# Patient Record
Sex: Male | Born: 1980 | Race: Black or African American | Hispanic: No | Marital: Single | Smoking: Current every day smoker
Health system: Southern US, Community
[De-identification: ages and names within clinical notes are randomized; demographics above are authoritative.]

## PROBLEM LIST (undated history)

## (undated) DIAGNOSIS — Z720 Tobacco use: Secondary | ICD-10-CM

## (undated) DIAGNOSIS — F1011 Alcohol abuse, in remission: Secondary | ICD-10-CM

---

## 2012-01-15 ENCOUNTER — Emergency Department: Payer: Self-pay | Admitting: Emergency Medicine

## 2012-01-18 ENCOUNTER — Emergency Department: Payer: Self-pay | Admitting: Emergency Medicine

## 2012-04-29 ENCOUNTER — Emergency Department: Payer: Self-pay | Admitting: Unknown Physician Specialty

## 2012-04-29 LAB — CBC
HCT: 42.6 % (ref 40.0–52.0)
HGB: 15 g/dL (ref 13.0–18.0)
MCHC: 35.1 g/dL (ref 32.0–36.0)
MCV: 91 fL (ref 80–100)
Platelet: 285 10*3/uL (ref 150–440)
RBC: 4.67 10*6/uL (ref 4.40–5.90)
WBC: 8 10*3/uL (ref 3.8–10.6)

## 2012-04-29 LAB — URINALYSIS, COMPLETE
Bacteria: NONE SEEN
Bilirubin,UR: NEGATIVE
Glucose,UR: NEGATIVE mg/dL (ref 0–75)
Leukocyte Esterase: NEGATIVE

## 2012-04-29 LAB — COMPREHENSIVE METABOLIC PANEL
Anion Gap: 8 (ref 7–16)
BUN: 11 mg/dL (ref 7–18)
Bilirubin,Total: 0.4 mg/dL (ref 0.2–1.0)
Chloride: 106 mmol/L (ref 98–107)
Co2: 23 mmol/L (ref 21–32)
Creatinine: 0.85 mg/dL (ref 0.60–1.30)
Glucose: 98 mg/dL (ref 65–99)
SGOT(AST): 26 U/L (ref 15–37)
Sodium: 137 mmol/L (ref 136–145)

## 2012-04-29 LAB — DRUG SCREEN, URINE
Amphetamines, Ur Screen: NEGATIVE (ref ?–1000)
Benzodiazepine, Ur Scrn: NEGATIVE (ref ?–200)
Cannabinoid 50 Ng, Ur ~~LOC~~: POSITIVE (ref ?–50)
MDMA (Ecstasy)Ur Screen: NEGATIVE (ref ?–500)
Opiate, Ur Screen: NEGATIVE (ref ?–300)

## 2012-04-29 LAB — LIPASE, BLOOD: Lipase: 75 U/L (ref 73–393)

## 2012-05-25 ENCOUNTER — Emergency Department: Payer: Self-pay | Admitting: Emergency Medicine

## 2012-05-25 LAB — COMPREHENSIVE METABOLIC PANEL
Albumin: 4.3 g/dL (ref 3.4–5.0)
Anion Gap: 7 (ref 7–16)
BUN: 12 mg/dL (ref 7–18)
Bilirubin,Total: 0.5 mg/dL (ref 0.2–1.0)
Calcium, Total: 9.1 mg/dL (ref 8.5–10.1)
Chloride: 104 mmol/L (ref 98–107)
Co2: 26 mmol/L (ref 21–32)
Creatinine: 0.86 mg/dL (ref 0.60–1.30)
SGOT(AST): 23 U/L (ref 15–37)
Sodium: 137 mmol/L (ref 136–145)
Total Protein: 8.6 g/dL — ABNORMAL HIGH (ref 6.4–8.2)

## 2012-05-25 LAB — URINALYSIS, COMPLETE
Bilirubin,UR: NEGATIVE
Protein: 100
Squamous Epithelial: NONE SEEN
WBC UR: <1 /HPF (ref 0–5)

## 2012-05-25 LAB — LIPASE, BLOOD: Lipase: 54 U/L — ABNORMAL LOW (ref 73–393)

## 2012-05-25 LAB — CBC
MCH: 31.6 pg (ref 26.0–34.0)
MCV: 93 fL (ref 80–100)
RDW: 13.8 % (ref 11.5–14.5)
WBC: 7.6 10*3/uL (ref 3.8–10.6)

## 2013-11-07 ENCOUNTER — Emergency Department: Payer: Self-pay | Admitting: Emergency Medicine

## 2013-11-07 LAB — CBC
HCT: 41.6 % (ref 40.0–52.0)
HGB: 14.5 g/dL (ref 13.0–18.0)
MCH: 32.9 pg (ref 26.0–34.0)
MCHC: 34.9 g/dL (ref 32.0–36.0)
MCV: 94 fL (ref 80–100)
Platelet: 292 10*3/uL (ref 150–440)
RBC: 4.41 10*6/uL (ref 4.40–5.90)
RDW: 13.2 % (ref 11.5–14.5)
WBC: 6.3 10*3/uL (ref 3.8–10.6)

## 2013-11-07 LAB — URINALYSIS, COMPLETE
Bacteria: NONE SEEN
Bilirubin,UR: NEGATIVE
GLUCOSE, UR: NEGATIVE mg/dL (ref 0–75)
KETONE: NEGATIVE
Leukocyte Esterase: NEGATIVE
NITRITE: NEGATIVE
Ph: 5 (ref 4.5–8.0)
Protein: 100
RBC,UR: <1 /HPF (ref 0–5)
SPECIFIC GRAVITY: 1.015 (ref 1.003–1.030)
WBC UR: 1 /HPF (ref 0–5)

## 2013-11-07 LAB — ETHANOL: Ethanol %: <0.003 % (ref 0.000–0.080)

## 2013-11-07 LAB — COMPREHENSIVE METABOLIC PANEL
ANION GAP: 3 — AB (ref 7–16)
Albumin: 3.7 g/dL (ref 3.4–5.0)
Alkaline Phosphatase: 70 U/L
BUN: 19 mg/dL — ABNORMAL HIGH (ref 7–18)
Bilirubin,Total: 0.2 mg/dL (ref 0.2–1.0)
CHLORIDE: 104 mmol/L (ref 98–107)
Calcium, Total: 9 mg/dL (ref 8.5–10.1)
Co2: 29 mmol/L (ref 21–32)
Creatinine: 1.15 mg/dL (ref 0.60–1.30)
EGFR (African American): >60
GLUCOSE: 154 mg/dL — AB (ref 65–99)
Osmolality: 277 (ref 275–301)
Potassium: 3.8 mmol/L (ref 3.5–5.1)
SGOT(AST): 25 U/L (ref 15–37)
SGPT (ALT): 28 U/L (ref 12–78)
Sodium: 136 mmol/L (ref 136–145)
Total Protein: 7.9 g/dL (ref 6.4–8.2)

## 2014-03-17 ENCOUNTER — Ambulatory Visit: Payer: Self-pay | Admitting: Internal Medicine

## 2014-10-19 ENCOUNTER — Encounter: Payer: Self-pay | Admitting: *Deleted

## 2014-10-19 ENCOUNTER — Emergency Department
Admission: EM | Admit: 2014-10-19 | Discharge: 2014-10-19 | Discharge status: Against Medical Advice | Payer: Self-pay | Attending: Student | Admitting: Student

## 2014-10-19 DIAGNOSIS — R1030 Lower abdominal pain, unspecified: Secondary | ICD-10-CM | POA: Insufficient documentation

## 2014-10-19 DIAGNOSIS — Z720 Tobacco use: Secondary | ICD-10-CM | POA: Insufficient documentation

## 2014-10-19 LAB — CBC WITH DIFFERENTIAL/PLATELET
BASOS ABS: 0 10*3/uL (ref 0–0.1)
BASOS PCT: 0 %
Eosinophils Absolute: 0 10*3/uL (ref 0–0.7)
Eosinophils Relative: 0 %
HCT: 44.1 % (ref 40.0–52.0)
Hemoglobin: 15.2 g/dL (ref 13.0–18.0)
Lymphocytes Relative: 5 %
Lymphs Abs: 0.7 10*3/uL — ABNORMAL LOW (ref 1.0–3.6)
MCH: 31.6 pg (ref 26.0–34.0)
MCHC: 34.5 g/dL (ref 32.0–36.0)
MCV: 91.7 fL (ref 80.0–100.0)
MONO ABS: 0.5 10*3/uL (ref 0.2–1.0)
MONOS PCT: 4 %
NEUTROS ABS: 11.7 10*3/uL — AB (ref 1.4–6.5)
NEUTROS PCT: 91 %
Platelets: 278 10*3/uL (ref 150–440)
RBC: 4.81 MIL/uL (ref 4.40–5.90)
RDW: 13.8 % (ref 11.5–14.5)
WBC: 13 10*3/uL — ABNORMAL HIGH (ref 3.8–10.6)

## 2014-10-19 LAB — COMPREHENSIVE METABOLIC PANEL
ALT: 42 U/L (ref 17–63)
ANION GAP: 11 (ref 5–15)
AST: 36 U/L (ref 15–41)
Albumin: 4.7 g/dL (ref 3.5–5.0)
Alkaline Phosphatase: 81 U/L (ref 38–126)
BUN: 20 mg/dL (ref 6–20)
CALCIUM: 10 mg/dL (ref 8.9–10.3)
CO2: 23 mmol/L (ref 22–32)
Chloride: 100 mmol/L — ABNORMAL LOW (ref 101–111)
Creatinine, Ser: 1.01 mg/dL (ref 0.61–1.24)
GFR calc non Af Amer: >60 mL/min (ref >60)
GLUCOSE: 144 mg/dL — AB (ref 65–99)
Potassium: 3.7 mmol/L (ref 3.5–5.1)
Sodium: 134 mmol/L — ABNORMAL LOW (ref 135–145)
Total Bilirubin: 0.7 mg/dL (ref 0.3–1.2)
Total Protein: 8.5 g/dL — ABNORMAL HIGH (ref 6.5–8.1)

## 2014-10-19 LAB — URINALYSIS COMPLETE WITH MICROSCOPIC (ARMC ONLY)
Bilirubin Urine: NEGATIVE
Glucose, UA: NEGATIVE mg/dL
Leukocytes, UA: NEGATIVE
Nitrite: NEGATIVE
Protein, ur: 100 mg/dL — AB
SPECIFIC GRAVITY, URINE: 1.017 (ref 1.005–1.030)
SQUAMOUS EPITHELIAL / LPF: NONE SEEN
pH: 5 (ref 5.0–8.0)

## 2014-10-19 LAB — AMYLASE: Amylase: 31 U/L (ref 28–100)

## 2014-10-19 LAB — LIPASE, BLOOD: Lipase: 27 U/L (ref 22–51)

## 2014-10-19 NOTE — ED Notes (Signed)
Pt is here with lower abdominal pain.  Earlier pt had nausea but no vomiting or diarrhea, no fever or chills.

## 2014-10-19 NOTE — ED Notes (Signed)
Pt reports that the pain began when he was drinking today.  Pt reports that he drinks daily (2 40's) and that he gets the shakes when he doesn't drink.

## 2014-10-19 NOTE — ED Provider Notes (Signed)
-----------------------------------------   11:45 PM on 10/19/2014 -----------------------------------------  I did not see, evaluate, or participate in the care of this patient.  Gayla Doss, MD 10/19/14 [phone]

## 2015-06-24 ENCOUNTER — Encounter: Payer: Self-pay | Admitting: Emergency Medicine

## 2015-06-24 ENCOUNTER — Emergency Department
Admission: EM | Admit: 2015-06-24 | Discharge: 2015-06-24 | Disposition: A | Discharge status: Home / Self Care | Payer: Self-pay | Attending: Emergency Medicine | Admitting: Emergency Medicine

## 2015-06-24 DIAGNOSIS — Z0000 Encounter for general adult medical examination without abnormal findings: Secondary | ICD-10-CM | POA: Insufficient documentation

## 2015-06-24 DIAGNOSIS — Z139 Encounter for screening, unspecified: Secondary | ICD-10-CM

## 2015-06-24 DIAGNOSIS — F17210 Nicotine dependence, cigarettes, uncomplicated: Secondary | ICD-10-CM | POA: Insufficient documentation

## 2015-06-24 HISTORY — DX: Alcohol abuse, in remission: F10.11

## 2015-06-24 NOTE — ED Provider Notes (Signed)
[facility] Emergency Department Provider Note  ____________________________________________  Time seen: On arrival  I have reviewed the triage vital signs and the nursing notes.   HISTORY  Chief Complaint Medical Clearance    HPI Francisco Barrett is a 35 y.o. male who presents for medical clearance for detox. Patient reports he feels well he has no complaints. He went to detox program and they sent him to the emergency department for medical clearance. He reports he drinks alcohol and smokes marijuana daily but has not had anything since yesterday. He does not feel like he is withdrawing.    Past Medical History  Diagnosis Date  . H/O ETOH abuse     There are no active problems to display for this patient.   History reviewed. No pertinent past surgical history.  No current outpatient prescriptions on file.  Allergies Review of patient's allergies indicates no known allergies.  No family history on file.  Social History Social History  Substance Use Topics  . Smoking status: Current Every Day Smoker -- 0.50 packs/day    Types: Cigarettes  . Smokeless tobacco: None  . Alcohol Use: Yes     Comment: 2 40's    Review of Systems  Constitutional: Negative for fever. Eyes: Negative for visual changes.    Musculoskeletal: Negative for back pain. Skin: Negative for rash. Neurological: Negative for headaches    ____________________________________________   PHYSICAL EXAM:  VITAL SIGNS: ED Triage Vitals  Enc Vitals Group     BP 06/24/15 1301 147/86 mmHg     Pulse Rate 06/24/15 1301 60     Resp 06/24/15 1301 20     Temp 06/24/15 1301 98.2 F (36.8 C)     Temp Source 06/24/15 1301 Oral     SpO2 06/24/15 1301 99 %     Weight 06/24/15 1301 120 lb (54.432 kg)     Height 06/24/15 1301  (1.6 m)     Head Cir --      Peak Flow --      Pain Score --      Pain Loc --      Pain Edu? --      Excl. in GC? --       Constitutional: Alert and oriented. Well appearing and in no distress. Eyes: Conjunctivae are normal.  ENT   Head: Normocephalic and atraumatic.   Mouth/Throat: Mucous membranes are moist. Cardiovascular: Normal rate, regular rhythm.  Respiratory: Normal respiratory effort without tachypnea nor retractions.  Gastrointestinal: Soft and non-tender in all quadrants. No distention. There is no CVA tenderness. Musculoskeletal: Nontender with normal range of motion in all extremities. Neurologic:  Normal speech and language. No gross focal neurologic deficits are appreciated. Skin:  Skin is warm, dry and intact. No rash noted. Psychiatric: Mood and affect are normal. Patient exhibits appropriate insight and judgment.  ____________________________________________    LABS (pertinent positives/negatives)  Labs Reviewed - No data to display  ____________________________________________     ____________________________________________    RADIOLOGY I have personally reviewed any xrays that were ordered on this patient: None  ____________________________________________   PROCEDURES  Procedure(s) performed: none   ____________________________________________   INITIAL IMPRESSION / ASSESSMENT AND PLAN / ED COURSE  Pertinent labs & imaging results that were available during my care of the patient were reviewed by me and considered in my medical decision making (see chart for details).  Patient well-appearing and in no distress. Did not feel labs are indicated as the patient is  young and healthy and has no medical history besides his alcohol use.  ____________________________________________   FINAL CLINICAL IMPRESSION(S) / ED DIAGNOSES  Final diagnoses:  Encounter for medical screening examination     Jene Every, MD 06/24/15 [phone]

## 2015-06-24 NOTE — Discharge Instructions (Signed)
Francisco Barrett is medically cleared for detox

## 2015-06-24 NOTE — ED Notes (Signed)
States went to [facility] for detox from ETOH. States they had him come here for clearance. Denies significant withdrawal or seizure with disuse in past. States last drink was yesterday.

## 2015-06-24 NOTE — Progress Notes (Signed)
Per the EDP requests' the pts vitals and H&P will be faxed to [facility].    06/24/2015 Cheryl Flash, MS, NCC, LPCA Therapeutic Triage Specialist

## 2017-05-05 ENCOUNTER — Emergency Department
Admission: EM | Admit: 2017-05-05 | Discharge: 2017-05-05 | Disposition: A | Discharge status: Home / Self Care | Payer: No Typology Code available for payment source | Attending: Emergency Medicine | Admitting: Emergency Medicine

## 2017-05-05 ENCOUNTER — Other Ambulatory Visit: Payer: Self-pay

## 2017-05-05 ENCOUNTER — Emergency Department: Payer: No Typology Code available for payment source

## 2017-05-05 ENCOUNTER — Encounter: Payer: Self-pay | Admitting: Emergency Medicine

## 2017-05-05 DIAGNOSIS — Y999 Unspecified external cause status: Secondary | ICD-10-CM | POA: Insufficient documentation

## 2017-05-05 DIAGNOSIS — Y92410 Unspecified street and highway as the place of occurrence of the external cause: Secondary | ICD-10-CM | POA: Insufficient documentation

## 2017-05-05 DIAGNOSIS — S3992XA Unspecified injury of lower back, initial encounter: Secondary | ICD-10-CM | POA: Diagnosis present

## 2017-05-05 DIAGNOSIS — S39012A Strain of muscle, fascia and tendon of lower back, initial encounter: Secondary | ICD-10-CM | POA: Diagnosis not present

## 2017-05-05 DIAGNOSIS — F17210 Nicotine dependence, cigarettes, uncomplicated: Secondary | ICD-10-CM | POA: Insufficient documentation

## 2017-05-05 DIAGNOSIS — Y939 Activity, unspecified: Secondary | ICD-10-CM | POA: Diagnosis not present

## 2017-05-05 MED ORDER — TRAMADOL HCL 50 MG PO TABS: 50.0000 mg | ORAL_TABLET | Freq: Four times a day (QID) | ORAL | 0 refills | Status: AC | PRN

## 2017-05-05 MED ORDER — CYCLOBENZAPRINE HCL 10 MG PO TABS: 10.0000 mg | ORAL_TABLET | Freq: Three times a day (TID) | ORAL | 0 refills | Status: DC | PRN
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
## 2019-11-24 ENCOUNTER — Emergency Department: Payer: Self-pay

## 2019-11-24 ENCOUNTER — Other Ambulatory Visit: Payer: Self-pay

## 2019-11-24 ENCOUNTER — Encounter: Payer: Self-pay | Admitting: Emergency Medicine

## 2019-11-24 ENCOUNTER — Emergency Department
Admission: EM | Admit: 2019-11-24 | Discharge: 2019-11-24 | Disposition: A | Discharge status: Home / Self Care | Payer: Self-pay | Attending: Emergency Medicine | Admitting: Emergency Medicine

## 2019-11-24 DIAGNOSIS — F17210 Nicotine dependence, cigarettes, uncomplicated: Secondary | ICD-10-CM | POA: Insufficient documentation

## 2019-11-24 DIAGNOSIS — G8929 Other chronic pain: Secondary | ICD-10-CM | POA: Insufficient documentation

## 2019-11-24 DIAGNOSIS — M25572 Pain in left ankle and joints of left foot: Secondary | ICD-10-CM | POA: Insufficient documentation

## 2019-11-24 MED ORDER — MELOXICAM 7.5 MG PO TABS: 7.5000 mg | ORAL_TABLET | Freq: Every day | ORAL | 0 refills | Status: AC

## 2019-11-24 MED ORDER — ACETAMINOPHEN 325 MG PO TABS
650.0000 mg | ORAL_TABLET | Freq: Once | ORAL | Status: AC
  Administered 2019-11-24: 650 mg via ORAL
  Filled 2019-11-24: qty 2
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
## 2019-11-24 NOTE — Discharge Instructions (Addendum)
Follow-up with Dr. Signa Kell who is the orthopedist on-call for any continued problems with your ankle.  You may also follow-up with your doctor at [facility].  A prescription for meloxicam was sent to your pharmacy to take once a day to reduce inflammation which will help with pain.  Elevation when possible.
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
## 2020-10-06 ENCOUNTER — Emergency Department (HOSPITAL_COMMUNITY): Payer: Self-pay

## 2020-10-06 ENCOUNTER — Observation Stay (HOSPITAL_COMMUNITY)
Admission: EM | Admit: 2020-10-06 | Discharge: 2020-10-07 | Disposition: A | Discharge status: Home / Self Care | Payer: Self-pay | Attending: Internal Medicine | Admitting: Internal Medicine

## 2020-10-06 ENCOUNTER — Other Ambulatory Visit: Payer: Self-pay

## 2020-10-06 ENCOUNTER — Encounter (HOSPITAL_COMMUNITY): Payer: Self-pay | Admitting: Emergency Medicine

## 2020-10-06 DIAGNOSIS — F17210 Nicotine dependence, cigarettes, uncomplicated: Secondary | ICD-10-CM | POA: Insufficient documentation

## 2020-10-06 DIAGNOSIS — R0602 Shortness of breath: Secondary | ICD-10-CM

## 2020-10-06 DIAGNOSIS — D72829 Elevated white blood cell count, unspecified: Secondary | ICD-10-CM | POA: Insufficient documentation

## 2020-10-06 DIAGNOSIS — Y900 Blood alcohol level of less than 20 mg/100 ml: Secondary | ICD-10-CM | POA: Insufficient documentation

## 2020-10-06 DIAGNOSIS — Z20822 Contact with and (suspected) exposure to covid-19: Secondary | ICD-10-CM | POA: Insufficient documentation

## 2020-10-06 DIAGNOSIS — Z720 Tobacco use: Secondary | ICD-10-CM

## 2020-10-06 DIAGNOSIS — T401X1A Poisoning by heroin, accidental (unintentional), initial encounter: Secondary | ICD-10-CM | POA: Insufficient documentation

## 2020-10-06 DIAGNOSIS — J9601 Acute respiratory failure with hypoxia: Principal | ICD-10-CM | POA: Diagnosis present

## 2020-10-06 DIAGNOSIS — F1910 Other psychoactive substance abuse, uncomplicated: Secondary | ICD-10-CM | POA: Insufficient documentation

## 2020-10-06 HISTORY — DX: Tobacco use: Z72.0

## 2020-10-06 LAB — URINALYSIS, ROUTINE W REFLEX MICROSCOPIC
Bacteria, UA: NONE SEEN
Bilirubin Urine: NEGATIVE
Glucose, UA: 50 mg/dL — AB
Ketones, ur: 5 mg/dL — AB
Leukocytes,Ua: NEGATIVE
Nitrite: NEGATIVE
Protein, ur: 100 mg/dL — AB
Specific Gravity, Urine: 1.014 (ref 1.005–1.030)
pH: 5 (ref 5.0–8.0)

## 2020-10-06 LAB — CBC
HCT: 42.1 % (ref 39.0–52.0)
Hemoglobin: 14.5 g/dL (ref 13.0–17.0)
MCH: 32.3 pg (ref 26.0–34.0)
MCHC: 34.4 g/dL (ref 30.0–36.0)
MCV: 93.8 fL (ref 80.0–100.0)
Platelets: 301 10*3/uL (ref 150–400)
RBC: 4.49 MIL/uL (ref 4.22–5.81)
RDW: 13.2 % (ref 11.5–15.5)
WBC: 16 10*3/uL — ABNORMAL HIGH (ref 4.0–10.5)
nRBC: 0 % (ref 0.0–0.2)

## 2020-10-06 LAB — RAPID URINE DRUG SCREEN, HOSP PERFORMED
Amphetamines: NOT DETECTED
Barbiturates: NOT DETECTED
Benzodiazepines: POSITIVE — AB
Cocaine: NOT DETECTED
Opiates: NOT DETECTED
Tetrahydrocannabinol: POSITIVE — AB

## 2020-10-06 LAB — BASIC METABOLIC PANEL
Anion gap: 9 (ref 5–15)
BUN: 8 mg/dL (ref 6–20)
CO2: 28 mmol/L (ref 22–32)
Calcium: 9.2 mg/dL (ref 8.9–10.3)
Chloride: 102 mmol/L (ref 98–111)
Creatinine, Ser: 0.79 mg/dL (ref 0.61–1.24)
GFR, Estimated: >60 mL/min (ref >60)
Glucose, Bld: 126 mg/dL — ABNORMAL HIGH (ref 70–99)
Potassium: 3.5 mmol/L (ref 3.5–5.1)
Sodium: 139 mmol/L (ref 135–145)

## 2020-10-06 LAB — RESP PANEL BY RT-PCR (FLU A&B, COVID) ARPGX2
Influenza A by PCR: NEGATIVE
Influenza B by PCR: NEGATIVE
SARS Coronavirus 2 by RT PCR: NEGATIVE

## 2020-10-06 LAB — ETHANOL: Alcohol, Ethyl (B): <10 mg/dL (ref <10)

## 2020-10-06 LAB — MAGNESIUM: Magnesium: 2.2 mg/dL (ref 1.7–2.4)

## 2020-10-06 MED ORDER — ACETAMINOPHEN 325 MG PO TABS: 650.0000 mg | ORAL_TABLET | Freq: Four times a day (QID) | ORAL | Status: DC | PRN

## 2020-10-06 MED ORDER — ACETAMINOPHEN 650 MG RE SUPP: 650.0000 mg | Freq: Four times a day (QID) | RECTAL | Status: DC | PRN

## 2020-10-06 MED ORDER — LACTATED RINGERS IV BOLUS
1000.0000 mL | Freq: Once | INTRAVENOUS | Status: AC
  Administered 2020-10-06: 1000 mL via INTRAVENOUS

## 2020-10-06 MED ORDER — HYDRALAZINE HCL 20 MG/ML IJ SOLN: 10.0000 mg | INTRAMUSCULAR | Status: DC | PRN

## 2020-10-06 MED ORDER — LACTATED RINGERS IV SOLN: INTRAVENOUS | Status: DC

## 2020-10-06 MED ORDER — ALBUTEROL SULFATE HFA 108 (90 BASE) MCG/ACT IN AERS
2.0000 | INHALATION_SPRAY | Freq: Once | RESPIRATORY_TRACT | Status: AC
  Administered 2020-10-06: 2 via RESPIRATORY_TRACT
  Filled 2020-10-06: qty 6.7

## 2020-10-06 MED ORDER — NALOXONE HCL 0.4 MG/ML IJ SOLN: 0.4000 mg | INTRAMUSCULAR | Status: DC | PRN

## 2020-10-06 MED ORDER — METHYLPREDNISOLONE SODIUM SUCC 125 MG IJ SOLR
125.0000 mg | Freq: Once | INTRAMUSCULAR | Status: AC
  Administered 2020-10-06: 125 mg via INTRAVENOUS
  Filled 2020-10-06: qty 2

## 2020-10-06 MED ORDER — ALBUTEROL SULFATE (2.5 MG/3ML) 0.083% IN NEBU: 2.5000 mg | INHALATION_SOLUTION | RESPIRATORY_TRACT | Status: DC | PRN

## 2020-10-06 NOTE — ED Provider Notes (Signed)
[facility]-EMERGENCY DEPT Provider Note   CSN: [id number] Arrival date & time: 10/06/20  1157     History Chief Complaint  Patient presents with   Drug Overdose    Laird Ronel Rodeheaver is a 40 y.o. male.  HPI Level 5 caveat secondary to intoxication 40 year old male presents to the ER after loss of consciousness, reportedly use intranasal heroin while at work today.  Unclear if he hit his head or not.  He was found to have agonal respirations by EMS, approximately 6/min.  EMS administered BVM, administered 0.5 mg of Narcan with ROSC.  Patient became alert and oriented x4.  On my exam, patient is arousable, able to answer some questions but is still very much and intoxicated.    Past Medical History:  Diagnosis Date   H/O ETOH abuse     There are no problems to display for this patient.   History reviewed. No pertinent surgical history.     No family history on file.  Social History   Tobacco Use   Smoking status: Every Day    Packs/day: 0.50    Pack years: 0.00    Types: Cigarettes   Smokeless tobacco: Never  Vaping Use   Vaping Use: Never used  Substance Use Topics   Alcohol use: Yes    Comment: 2 40's   Drug use: Yes    Types: Marijuana, Cocaine    Home Medications Prior to Admission medications   Medication Sig Start Date End Date Taking? Authorizing Provider  meloxicam (MOBIC) 7.5 MG tablet Take 1 tablet (7.5 mg total) by mouth daily. 11/24/19 11/23/20  Tommi Rumps, PA-C    Allergies    Patient has no known allergies.  Review of Systems   Review of Systems  Unable to perform ROS: Mental status change   Physical Exam Updated Vital Signs BP 128/85   Pulse (!) 56   Temp (!) 97.4 F (36.3 C) (Oral)   Resp 12   SpO2 93%   Physical Exam Vitals and nursing note reviewed.  Constitutional:      Appearance: Normal appearance. He is well-developed. He is not ill-appearing.  HENT:     Head: Normocephalic and  atraumatic.  Eyes:     Conjunctiva/sclera: Conjunctivae normal.  Cardiovascular:     Rate and Rhythm: Normal rate and regular rhythm.     Heart sounds: No murmur heard. Pulmonary:     Effort: Pulmonary effort is normal. No respiratory distress.     Breath sounds: Normal breath sounds.  Abdominal:     Palpations: Abdomen is soft.     Tenderness: There is no abdominal tenderness.  Musculoskeletal:     Cervical back: Neck supple.  Skin:    General: Skin is warm and dry.  Neurological:     Comments: Patient is sleepy, but easily arousable.  Answering some questions appropriately but does appear to be visibly intoxicated still.  Cranial nerves II through XII intact    ED Results / Procedures / Treatments   Labs (all labs ordered are listed, but only abnormal results are displayed) Labs Reviewed  CBC - Abnormal; Notable for the following components:      Result Value   WBC 16.0 (*)    All other components within normal limits  BASIC METABOLIC PANEL - Abnormal; Notable for the following components:   Glucose, Bld 126 (*)    All other components within normal limits  URINALYSIS, ROUTINE W REFLEX MICROSCOPIC - Abnormal; Notable for the  following components:   Glucose, UA 50 (*)    Hgb urine dipstick MODERATE (*)    Ketones, ur 5 (*)    Protein, ur 100 (*)    All other components within normal limits  ETHANOL  RAPID URINE DRUG SCREEN, HOSP PERFORMED    EKG None  Radiology No results found.  Procedures Procedures   Medications Ordered in ED Medications - No data to display  ED Course  I have reviewed the triage vital signs and the nursing notes.  Pertinent labs & imaging results that were available during my care of the patient were reviewed by me and considered in my medical decision making (see chart for details).    MDM Rules/Calculators/A&P                          40 year old male who presents with heroin overdose.  He arrives still fairly sleepy, easily  arousable but can only answer some questions appropriately.  Plan for basic labs and CT of the head given unclear if he hit his head or not.  He will need to be observed until he becomes more clinically sober.  Labs with no significant anemia, CBC with a leukocytosis of 16 likely reactive to drug use.  Ethanol negative.  UA  with moderate materia, proteinuria, ketones, likely consistent with dehydration.  On reevaluation, patient is still clinically intoxicated.  Will need further observation.  Signed out to [employer].  If clinically sober, suspect the patient be stable for discharge.  Final Clinical Impression(s) / ED Diagnoses Final diagnoses:  Accidental overdose of heroin, initial encounter [facility])    Rx / DC Orders ED Discharge Orders     None        Leone Brand 10/06/20 1502    Vanetta Mulders, MD 10/09/20 1630

## 2020-10-06 NOTE — ED Notes (Signed)
Patients oxygen saturation dropped to 82% on room air. RN placed patient back on 4L [place] that improved his saturations to 99%.

## 2020-10-06 NOTE — ED Notes (Signed)
Patient ambulated in room. Patient said he felt "a little dizzy" and about lost his balance one time.

## 2020-10-06 NOTE — ED Provider Notes (Signed)
Francisco Barrett is a 40 y.o. male, presenting to the ED with concern for possible opiate overdose. To hear the patient tell his story, he states he did snort what he thought was heroin, but he now suspects may have been fentanyl, however, his loss of consciousness was delayed.  He is concerned he had exposure to a chemical in his lungs as he is employed by a [employer] and is around several chemicals that can be airborne. RN also tells me EMS reported they initially thought it was a chemical exposure and they followed hazmat protocols, dousing the patient copiously with water. He does admit to using heroin, crack cocaine, and frequent alcohol.  He will not quantify any of these amounts of use.  HPI from Trudee Grip, PA-C: "Level 5 caveat secondary to intoxication 40 year old male presents to the ER after loss of consciousness, reportedly use intranasal heroin while at work today.  Unclear if he hit his head or not.  He was found to have agonal respirations by EMS, approximately 6/min.  EMS administered BVM, administered 0.5 mg of Narcan with ROSC.  Patient became alert and oriented x4.  On my exam, patient is arousable, able to answer some questions but is still very much and intoxicated."   Past Medical History:  Diagnosis Date  . H/O ETOH abuse     Physical Exam  BP (!) 130/91   Pulse (!) 58   Temp (!) 97.4 F (36.3 C) (Oral)   Resp 12   SpO2 93%   Physical Exam Vitals and nursing note reviewed.  Constitutional:      General: He is not in acute distress.    Appearance: He is well-developed. He is not diaphoretic.  HENT:     Head: Normocephalic and atraumatic.     Mouth/Throat:     Mouth: Mucous membranes are moist.     Pharynx: Oropharynx is clear.  Eyes:     Conjunctiva/sclera: Conjunctivae normal.  Cardiovascular:     Rate and Rhythm: Normal rate and regular rhythm.     Pulses: Normal pulses.          Radial pulses are 2+ on the right side and 2+ on the left  side.       Posterior tibial pulses are 2+ on the right side and 2+ on the left side.     Heart sounds: Normal heart sounds.     Comments: Tactile temperature in the extremities appropriate and equal bilaterally. Pulmonary:     Breath sounds: Decreased breath sounds and wheezing present.  Abdominal:     Palpations: Abdomen is soft.     Tenderness: There is no abdominal tenderness. There is no guarding.  Musculoskeletal:     Cervical back: Neck supple.     Right lower leg: No edema.     Left lower leg: No edema.  Lymphadenopathy:     Cervical: No cervical adenopathy.  Skin:    General: Skin is warm and dry.  Neurological:     Mental Status: He is alert and oriented to person, place, and time.     Comments: No noted acute cognitive deficit. Sensation grossly intact to light touch in the extremities.   Grip strengths equal bilaterally.   Strength 5/5 in all extremities.  Coordination intact.  Cranial nerves III-XII grossly intact.  Handles oral secretions without noted difficulty.  No noted phonation or speech deficit. No facial droop.   Psychiatric:        Mood and Affect: Mood  and affect normal.        Speech: Speech normal.        Behavior: Behavior normal.    ED Course/Procedures     Procedures  Abnormal Labs Reviewed  CBC - Abnormal; Notable for the following components:      Result Value   WBC 16.0 (*)    All other components within normal limits  BASIC METABOLIC PANEL - Abnormal; Notable for the following components:   Glucose, Bld 126 (*)    All other components within normal limits  URINALYSIS, ROUTINE W REFLEX MICROSCOPIC - Abnormal; Notable for the following components:   Glucose, UA 50 (*)    Hgb urine dipstick MODERATE (*)    Ketones, ur 5 (*)    Protein, ur 100 (*)    All other components within normal limits  RAPID URINE DRUG SCREEN, HOSP PERFORMED - Abnormal; Notable for the following components:   Benzodiazepines POSITIVE (*)     Tetrahydrocannabinol POSITIVE (*)    All other components within normal limits    CT Head Wo Contrast  Result Date: 10/06/2020 CLINICAL DATA:  Recent heroin use with loss of consciousness. EXAM: CT HEAD WITHOUT CONTRAST TECHNIQUE: Contiguous axial images were obtained from the base of the skull through the vertex without intravenous contrast. COMPARISON:  None. FINDINGS: Brain: No evidence of acute infarction, hemorrhage, hydrocephalus, extra-axial collection or mass lesion/mass effect. Vascular: No hyperdense vessel or unexpected calcification. Skull: Normal. Negative for fracture or focal lesion. Sinuses/Orbits: Orbits are normal. Moderate opacification over the lower half of the right maxillary sinus. Other: None. IMPRESSION: 1. No acute findings. 2. Chronic inflammatory change of the right maxillary sinus. Electronically Signed   By: Elberta Fortis M.D.   On: 10/06/2020 15:43   DG CHEST PORT 1 VIEW  Result Date: 10/06/2020 CLINICAL DATA:  Shortness of breath. EXAM: PORTABLE CHEST 1 VIEW COMPARISON:  None. FINDINGS: The heart size and mediastinal contours are within normal limits. Both lungs are clear. The visualized skeletal structures are unremarkable. IMPRESSION: No active disease. Electronically Signed   By: Gerome Sam III M.D   On: 10/06/2020 17:51       MDM   Clinical Course as of 10/06/20 2157  Wynelle Link Oct 06, 2020  1722 Patient SPO2 on room air noted to be around 90%.  Lungs are diminished with possible wheezes.  Denies chest pain, shortness of breath. [SJ]  1920 Patient reevaluated.  He is sleeping, but easily arousable.  Continues to be completely oriented. Room air SPO2 up to 95% following albuterol inhaler treatments. [SJ]  2144 Spoke with Dr. Arlean Hopping, Hospitalist.  Agrees to admit the patient. [SJ]    Clinical Course User Index [SJ] Anselm Pancoast, PA-C   Patient care handoff report received from Trudee Grip, PA-C. Plan: Reassess until clinically sober.  Patient presents  following what was suspected to be opiate overdose.  Initially with respiratory rate of 6 breaths/min on scene with EMS.  Improved with Narcan. Upon my assessment, he is somnolent, but easily arousable and once awake he is oriented x4.  I did note some diminished lung sounds and possible wheezing.  SPO2 on room air at that time was around 90%.  Upon repeat assessment, this seemed to improve to around 95% on room air, however, there were a couple subsequent episodes of SPO2 readings into the 80s, requiring the patient to be placed back on supplemental O2. It is possible patient aspirated during his ordeal and is developing pneumonitis. He also seems  to be unsteady with ambulation. Less suspicion for PE bc change in SPO2 is delayed and patient has no chest complaints.  Due to persistence and changing symptoms, I suspect patient may benefit from overnight observation.   Findings and plan of care discussed with attending physician, Theda Belfast, MD.   Vitals:   10/06/20 2000 10/06/20 2008 10/06/20 2030 10/06/20 2100  BP: (!) 141/97  (!) 148/99 (!) 182/73  Pulse: 67 60 (!) 58 65  Resp: 10 11 14 13   Temp:      TempSrc:      SpO2: 99% 98% 99% 99%             10/06/20 2157    [place], 2158, MD 10/07/20 [phone]

## 2020-10-06 NOTE — ED Triage Notes (Addendum)
[facility] transported pt from work and reports the following.  Pt snorted heroin for the first time while at work. He lost consciousness and employees called 911. Upon EMS arrival, pt breathing w agonal respirations around 6 respirations per min. EMS applied BVM, inserted 18 g IV in left AC, and administered 0.5mg  IV narcan. Pt responded to narcan and was Aox4.

## 2020-10-06 NOTE — ED Notes (Signed)
Francisco Barrett [phone] needs to be call to come to pick up patient when he is released.

## 2020-10-06 NOTE — H&P (Signed)
History and Physical    PLEASE NOTE THAT DRAGON DICTATION SOFTWARE WAS USED IN THE CONSTRUCTION OF THIS NOTE.   Francisco Barrett [id number] DOB: 10-17-1980 DOA: 10/06/2020  PCP: Patient, No Pcp Per (Inactive) Patient coming from: home   I have personally briefly reviewed patient's old medical records in [facility]  Chief Complaint: Diminished responsiveness  HPI: Francisco Barrett is a 40 y.o. male with medical history significant for chronic tobacco abuse who is admitted to [facility] on 10/06/2020 with acute hypoxic respiratory failure after presenting to [facility] ED via EMS for evaluation of diminished responsiveness.  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  Upon arrival at the concrete plant, EMS reportedly administered Narcan with significant improvement in the patient's responsiveness.  The patient reportedly conveyed to EMS that he used heroin in close temporal proximity to this unresponsive episode.  He was subsequently brought by EMS to [facility] emergency department for further evaluation of this initial episode of unresponsiveness.   Upon arrival at [facility] long ED, the patient conveys his recent heroin use, although his route of administration of such as not clear to me at this time.  Patient denies any recent chest pain, shortness of breath, palpitations, diaphoresis, dizziness.  Denies any recent nausea, vomiting, abdominal pain, diarrhea, melena, or hematochezia.  No preceding trauma.  Denies any recent cough, wheeze, hemoptysis, lower extremity erythema, calf tenderness, or peripheral edema.  Denies any recent orthopnea, PND.  No recent trauma, travel, or surgical procedures.  Denies any recent subjective fever, chills, rigors, or generalized myalgias.  No recent known COVID-19 exposures.  Denies any recent  headache, neck stiffness, sore throat.  He also denies any acute urinary symptoms, including no recent dysuria, gross hematuria, or change in urinary urgency/frequency.  Overall, conveys that he is entirely asymptomatic at this time.  The patient confirms that he is a current smoker, having smoked approximately half pack per day for at least the last 10 years, but denies any known underlying chronic pulmonary including no known underlying COPD or asthma.  Denies any known baseline supplemental oxygen requirements.  He confirms that he typically consumes 3 beers every 3 to 4 days without any recent deviation, including no recent increase relative to this baseline rate and volume of consumption.     ED Course:  Vital signs in the ED were notable for the following: Temperature max 97.4, heart rate 54-68; blood pressure 128/85 157/71; initial respiratory rate noted to be 8-12, but the tincture of time, most recent respiratory rate noted to be 16-18; initial oxygen saturation 89% on room air, with ensuing improvement to 95 to 96% on 4 L nasal cannula, with most recent oxygen saturation noted to be 97 to 99% on 2 L nasal cannula.  Labs were notable for the following: BMP was notable for the following: Sodium 139, potassium 3.5, bicarbonate 28, creatinine 0.79, glucose 126.  CBC notable for white blood cell count of 16,000, hemoglobin 14.5.  Urinalysis showed no white blood cells, no bacteria, nitrate negative, leukocyte Estrace negative, will also found to be positive for hyaline casts and associate with specific gravity of 1.014.  Nasopharyngeal COVID-19/influenza PCR performed in the emergency department today and found to be negative.  Urinary drug screen was positive for THC as well as benzodiazepines, but negative for opiates, cocaine, and amphetamines.  Chest x-ray  showed no evidence of acute cardiopulmonary process, including no evidence of infiltrate, edema, effusion, or pneumothorax.  Given his  initial diminished responsiveness prompting EMS to be called out to the concrete plan, CT head was pursued in the ED this evening and demonstrated no evidence of acute intracranial process, including no evidence of intracranial hemorrhage or acute infarct.  While in the ED, the following were administered: Albuterol inhaler x2 puffs, as well as lactated Ringer's x1 L bolus in the setting of the patient's residual supplemental oxygen requirement patient was subsequently admitted to the PCU for overnight observation for further evaluation and management of presenting acute hypoxic respiratory failure in the setting of suspected heroin abuse.    Review of Systems: As per HPI otherwise 10 point review of systems negative.   Past Medical History:  Diagnosis Date   H/O ETOH abuse    Tobacco abuse     History reviewed. No pertinent surgical history.  Social History:  reports that he has been smoking cigarettes. He has been smoking an average of 0.50 packs per day. He has never used smokeless tobacco. He reports current alcohol use. He reports current drug use. Drugs: Marijuana and Cocaine.   No Known Allergies  Family history reviewed and not pertinent    Prior to Admission medications   Medication Sig Start Date End Date Taking? Authorizing Provider  meloxicam (MOBIC) 7.5 MG tablet Take 1 tablet (7.5 mg total) by mouth daily. Patient not taking: Reported on 10/06/2020 11/24/19 11/23/20  Tommi Rumps, PA-C     Objective    Physical Exam: Vitals:   10/06/20 2030 10/06/20 2100 10/06/20 2130 10/06/20 2141  BP: (!) 148/99 (!) 182/73 (!) 133/95   Pulse: (!) 58 65 (!) 57 81  Resp: 14 13 16 17   Temp:      TempSrc:      SpO2: 99% 99% 97% 100%    General: appears to be stated age; alert, oriented Skin: warm, dry, no rash Head:  AT/[place] Mouth:  Oral mucosa membranes appear dry, normal dentition Neck: supple; trachea midline Heart:  RRR; did not appreciate any M/R/G Lungs: CTAB, did  not appreciate any wheezes, rales, or rhonchi Abdomen: + BS; soft, ND, NT Vascular: 2+ pedal pulses b/l; 2+ radial pulses b/l Extremities: no peripheral edema, no muscle wasting Neuro: strength and sensation intact in upper and lower extremities b/l   Labs on Admission: I have personally reviewed following labs and imaging studies  CBC: Recent Labs  Lab 10/06/20 1355  WBC 16.0*  HGB 14.5  HCT 42.1  MCV 93.8  PLT 301   Basic Metabolic Panel: Recent Labs  Lab 10/06/20 1355  NA 139  K 3.5  CL 102  CO2 28  GLUCOSE 126*  BUN 8  CREATININE 0.79  CALCIUM 9.2   GFR: CrCl cannot be calculated (Unknown ideal weight.). Liver Function Tests: No results for input(s): AST, ALT, ALKPHOS, BILITOT, PROT, ALBUMIN in the last 168 hours. No results for input(s): LIPASE, AMYLASE in the last 168 hours. No results for input(s): AMMONIA in the last 168 hours. Coagulation Profile: No results for input(s): INR, PROTIME in the last 168 hours. Cardiac Enzymes: No results for input(s): CKTOTAL, CKMB, CKMBINDEX, TROPONINI in the last 168 hours. BNP (last 3 results) No results for input(s): PROBNP in the last 8760 hours. HbA1C: No results for input(s): HGBA1C in the last 72 hours. CBG: No results for input(s): GLUCAP in the last 168 hours. Lipid Profile: No results for input(s): CHOL,  HDL, LDLCALC, TRIG, CHOLHDL, LDLDIRECT in the last 72 hours. Thyroid Function Tests: No results for input(s): TSH, T4TOTAL, FREET4, T3FREE, THYROIDAB in the last 72 hours. Anemia Panel: No results for input(s): VITAMINB12, FOLATE, FERRITIN, TIBC, IRON, RETICCTPCT in the last 72 hours. Urine analysis:    Component Value Date/Time   COLORURINE YELLOW 10/06/2020 1440   APPEARANCEUR CLEAR 10/06/2020 1440   APPEARANCEUR Clear 11/07/2013 1233   LABSPEC 1.014 10/06/2020 1440   LABSPEC 1.015 11/07/2013 1233   PHURINE 5.0 10/06/2020 1440   GLUCOSEU 50 (A) 10/06/2020 1440   GLUCOSEU Negative 11/07/2013 1233    HGBUR MODERATE (A) 10/06/2020 1440   BILIRUBINUR NEGATIVE 10/06/2020 1440   BILIRUBINUR Negative 11/07/2013 1233   KETONESUR 5 (A) 10/06/2020 1440   PROTEINUR 100 (A) 10/06/2020 1440   NITRITE NEGATIVE 10/06/2020 1440   LEUKOCYTESUR NEGATIVE 10/06/2020 1440   LEUKOCYTESUR Negative 11/07/2013 1233    Radiological Exams on Admission: CT Head Wo Contrast  Result Date: 10/06/2020 CLINICAL DATA:  Recent heroin use with loss of consciousness. EXAM: CT HEAD WITHOUT CONTRAST TECHNIQUE: Contiguous axial images were obtained from the base of the skull through the vertex without intravenous contrast. COMPARISON:  None. FINDINGS: Brain: No evidence of acute infarction, hemorrhage, hydrocephalus, extra-axial collection or mass lesion/mass effect. Vascular: No hyperdense vessel or unexpected calcification. Skull: Normal. Negative for fracture or focal lesion. Sinuses/Orbits: Orbits are normal. Moderate opacification over the lower half of the right maxillary sinus. Other: None. IMPRESSION: 1. No acute findings. 2. Chronic inflammatory change of the right maxillary sinus. Electronically Signed   By: Elberta Fortisaniel  Boyle M.D.   On: 10/06/2020 15:43   DG CHEST PORT 1 VIEW  Result Date: 10/06/2020 CLINICAL DATA:  Shortness of breath. EXAM: PORTABLE CHEST 1 VIEW COMPARISON:  None. FINDINGS: The heart size and mediastinal contours are within normal limits. Both lungs are clear. The visualized skeletal structures are unremarkable. IMPRESSION: No active disease. Electronically Signed   By: Gerome Samavid  Williams III M.D   On: 10/06/2020 17:51       Assessment/Plan   Francisco Barrett is a 40 y.o. male with medical history significant for chronic tobacco abuse who [facility]06/2020 with acute hypoxic respiratory failure after [facility] for evaluation of diminished responsiveness.   Principal Problem:   Acute respiratory failure with hypoxia (HCC) Active Problems:    Tobacco abuse   Polysubstance abuse (HCC)   Leukocytosis      #) Acute hypoxic respiratory failure: in the context of no known baseline supplemental oxygen requirements, presenting O2 sat noted to be 89% on room air, with ensuing improvement to 95 to 96% on 4 L nasal cannula, meeting criteria for acute hypoxic respiratory failure as opposed to distress. No known chronic underlying pulmonary conditions.  Suspect that presenting acute hypoxic respiratory failure is on the basis of resolving opioid-induced diminished respiratory drive as a consequence of reported recent heroin use, which appears consistent with improvement in the patient's degree of responsiveness following administration of Narcan by EMS as well as continued improvement in the patient's supplemental oxygen demands with tincture of time and limited interval non-Narcan pharmacologic intervention, with oxygen saturation now noted to be in the high 90s on 2 L nasal cannula, corresponding to increasing respiratory rate via likely progressive metabolism of heroin via tincture of time.  Specifically, initial respiratory rate upon arrival at the emergency department noted to be low in the range of 8-12, with ensuing improvement into  the range of 16-18 following tincture of time and associated diminished supplemental oxygen requirements.   Of note, presenting chest x-ray showed no evidence of acute cardiopulmonary process, including no evidence infiltrate, edema, effusion, or pneumothorax.  Patient is without any acute complaints at this time, including no acute respiratory complaints.  Given his asymptomatic nature, with a clear presenting chest x-ray and lack of septic correlation, underlying pneumonia is felt to be much less likely at this time additionally, nasopharyngeal COVID-19/influenza PCR were performed in the ED this evening and found to be negative.  Additionally, ACS is felt to be less likely at this time in the absence of any associated  chest pain.  Will check EKG to further evaluate.  No clinical or radiographic evidence to suggest acutely decompensated heart failure at this time.  The possibility of acute pulmonary embolism was considered given initial diminished responsiveness as well as acute hypoxic respiratory failure.  However, he does not appear to possess any risk factors for acute pulmonary embolism presentation and brisk improving trend in his respiratory status appears much less suggestive of acute pulmonary embolism relative to initial respiratory failure on the basis of heroin abuse contributing to diminished respiratory drive.  Should patient's respiratory status subsequently worsened including ensuing increase in supplemental oxygen demands, consider further evaluation for underlying CTA, including starting with a D-dimer.  Of note, the patient conveys that he is a long-term smoker, but denies any known history of underlying COPD, and presentation does not appear consistent with an acute COPD exacerbation.   Plan: monitoring of continuous pulse oximetry with prn supplemental O2 to maintain O2 sats greater than or equal to 92%. monitor on telemetry.  As needed Narcan.  Check blood gas.  Check CMP and CBC in the morning. Check serum Mg and Phos levels. Considering checking ABG. Flutter valve and incentive spirometry.  Counseled the patient on the importance of abstinence from recreational drug use.  Check EKG as above.  As needed albuterol inhaler.       #) Polysubstance abuse: Reported recent heroin abuse, as further detailed above, with urinary drug screen notable for positive THC findings.  Suspect contribution from his polysubstance abuse leading to presenting acute hypoxic respiratory failure as a consequence of associated diminished respiratory drive additionally, as further detailed above.   Plan: Further evaluation management of presenting acute hypoxic respiratory failure, including as needed Narcan.  Counseled  the patient on the importance of abstinence from recreational drug use.  Have also placed a consult with the transition of care team.  Lactated Ringer's at 100 cc/h.  Repeat CMP and CBC in the morning.        #) Leukocytosis: Presenting CBC reflects mildly elevated white blood cell count of 16,000.  No evidence of underlying infectious process at this time, including COVID-19/influenza PCR that were found to be negative in the ED this evening, while urinalysis is inconsistent with UTI and chest x-ray shows no evidence of acute cardiopulmonary process.  Patient denies any meningismus signs to suggest underlying meningitis.  No abdominal pain or diarrhea.  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  We will provide overnight IV fluids, and repeat CBC in the morning, while monitoring for interval development of fever.   Plan: Continuous IV fluids, as above.  Repeat CBC with differential in the morning.       #) Chronic tobacco abuse: Patient acknowledges that he is a current smoker, having smoked approximately half pack per  day for at least the last 10 years.  Plan: Counseled the patient on the importance of complete smoking discontinuation.     DVT prophylaxis: scd's  Code Status: Full code Family Communication: none Disposition Plan: Per Rounding Team Consults called: none  Admission status: Observation; PCU     Of note, this patient was added by me to the following Admit List/Treatment Team: [email or web address].      PLEASE NOTE THAT DRAGON DICTATION SOFTWARE WAS USED IN THE CONSTRUCTION OF THIS NOTE.   Angie Fava DO [facility] Pager [phone] From 6PM - 2AM  Otherwise, please contact night-coverage  [email or web address] Password [facility]   10/06/2020, 10:03 PM

## 2020-10-07 ENCOUNTER — Encounter (HOSPITAL_COMMUNITY): Payer: Self-pay | Admitting: Internal Medicine

## 2020-10-07 DIAGNOSIS — F1910 Other psychoactive substance abuse, uncomplicated: Secondary | ICD-10-CM | POA: Diagnosis present

## 2020-10-07 DIAGNOSIS — D72829 Elevated white blood cell count, unspecified: Secondary | ICD-10-CM | POA: Diagnosis present

## 2020-10-07 DIAGNOSIS — Z720 Tobacco use: Secondary | ICD-10-CM | POA: Diagnosis present

## 2020-10-07 LAB — COMPREHENSIVE METABOLIC PANEL
ALT: 173 U/L — ABNORMAL HIGH (ref 0–44)
AST: 89 U/L — ABNORMAL HIGH (ref 15–41)
Albumin: 3.8 g/dL (ref 3.5–5.0)
Alkaline Phosphatase: 135 U/L — ABNORMAL HIGH (ref 38–126)
Anion gap: 9 (ref 5–15)
BUN: 9 mg/dL (ref 6–20)
CO2: 29 mmol/L (ref 22–32)
Calcium: 9.2 mg/dL (ref 8.9–10.3)
Chloride: 101 mmol/L (ref 98–111)
Creatinine, Ser: 0.82 mg/dL (ref 0.61–1.24)
GFR, Estimated: >60 mL/min (ref >60)
Glucose, Bld: 116 mg/dL — ABNORMAL HIGH (ref 70–99)
Potassium: 3.7 mmol/L (ref 3.5–5.1)
Sodium: 139 mmol/L (ref 135–145)
Total Bilirubin: 0.4 mg/dL (ref 0.3–1.2)
Total Protein: 7.4 g/dL (ref 6.5–8.1)

## 2020-10-07 LAB — CBC
HCT: 42.7 % (ref 39.0–52.0)
Hemoglobin: 14.4 g/dL (ref 13.0–17.0)
MCH: 31.9 pg (ref 26.0–34.0)
MCHC: 33.7 g/dL (ref 30.0–36.0)
MCV: 94.7 fL (ref 80.0–100.0)
Platelets: 289 10*3/uL (ref 150–400)
RBC: 4.51 MIL/uL (ref 4.22–5.81)
RDW: 13.2 % (ref 11.5–15.5)
WBC: 14.4 10*3/uL — ABNORMAL HIGH (ref 4.0–10.5)
nRBC: 0 % (ref 0.0–0.2)

## 2020-10-07 LAB — HIV ANTIBODY (ROUTINE TESTING W REFLEX): HIV Screen 4th Generation wRfx: NONREACTIVE

## 2020-10-07 LAB — BLOOD GAS, VENOUS
Acid-Base Excess: 2.4 mmol/L — ABNORMAL HIGH (ref 0.0–2.0)
Bicarbonate: 27.7 mmol/L (ref 20.0–28.0)
O2 Saturation: 84 %
Patient temperature: 98.6
pCO2, Ven: 47.1 mmHg (ref 44.0–60.0)
pH, Ven: 7.386 (ref 7.250–7.430)
pO2, Ven: 47.9 mmHg — ABNORMAL HIGH (ref 32.0–45.0)

## 2020-10-07 LAB — PHOSPHORUS: Phosphorus: 2.7 mg/dL (ref 2.5–4.6)

## 2020-10-07 LAB — MAGNESIUM: Magnesium: 2 mg/dL (ref 1.7–2.4)

## 2020-10-07 MED ORDER — ACETAMINOPHEN 325 MG PO TABS: 650.0000 mg | ORAL_TABLET | Freq: Four times a day (QID) | ORAL | Status: DC | PRN

## 2020-10-07 NOTE — Discharge Summary (Signed)
Physician Discharge Summary  Francisco Barrett JXB:[id number] DOB: June 01, 1980 DOA: 10/06/2020  PCP: Patient, No Pcp Per (Inactive)  Admit date: 10/06/2020 Discharge date: 10/07/2020  Time spent:  Recommendations for Outpatient Follow-up:  PCP in 1 week  Discharge Diagnoses:  Principal Problem:   Acute respiratory failure with hypoxia (HCC) Active Problems:   Tobacco abuse   Polysubstance abuse (HCC)   Leukocytosis   Discharge Condition: Stable  Diet recommendation: Regular  Filed Weights   10/06/20 2311 10/07/20 0500  Weight: 58 kg 59.3 kg    History of present illness:  40 year old male with history of tobacco abuse was brought to [facility] long hospital after being found poorly responsive at work, EMT reported that he had snorted heroin for the first time.  Patient declines this and reports that he consumed some pills that he got off the street, he is unclear as to what it was  Hospital Course:   Overdose Lethargy Acute hypoxic respiratory failure-from CNS depression -Brought to the ED after being found poorly responsive at work  -In the ED he was hypoxic and lethargic, now improved.  his UDS was positive for THC and benzodiazepines, patient denies using heroin although this was reported by EMS based on report from scene.  He admits to consuming some pills that he got off the street however he does not know what it was. -This morning he is awake alert, oriented x3, without any symptoms -Discharged home in stable condition, advised to refrain from controlled substances  Discharge Exam: Vitals:   10/07/20 0307 10/07/20 0700  BP: 133/79 128/80  Pulse: (!) 56 60  Resp: 20   Temp: 98.7 F (37.1 C) 98.4 F (36.9 C)  SpO2: 94% 95%    General: AAOx3 Cardiovascular: S1S2/RRR Respiratory: CTAB  Discharge Instructions   Discharge Instructions     Diet general   Complete by: As directed    Increase activity slowly   Complete by: As directed        Allergies as of 10/07/2020   No Known Allergies      Medication List     TAKE these medications    acetaminophen 325 MG tablet Commonly known as: TYLENOL Take 2 tablets (650 mg total) by mouth every 6 (six) hours as needed for mild pain (or Fever >/= 101).   meloxicam 7.5 MG tablet Commonly known as: Mobic Take 1 tablet (7.5 mg total) by mouth daily.       No Known Allergies    The results of significant diagnostics from this hospitalization (including imaging, microbiology, ancillary and laboratory) are listed below for reference.    Significant Diagnostic Studies: CT Head Wo Contrast  Result Date: 10/06/2020 CLINICAL DATA:  Recent heroin use with loss of consciousness. EXAM: CT HEAD WITHOUT CONTRAST TECHNIQUE: Contiguous axial images were obtained from the base of the skull through the vertex without intravenous contrast. COMPARISON:  None. FINDINGS: Brain: No evidence of acute infarction, hemorrhage, hydrocephalus, extra-axial collection or mass lesion/mass effect. Vascular: No hyperdense vessel or unexpected calcification. Skull: Normal. Negative for fracture or focal lesion. Sinuses/Orbits: Orbits are normal. Moderate opacification over the lower half of the right maxillary sinus. Other: None. IMPRESSION: 1. No acute findings. 2. Chronic inflammatory change of the right maxillary sinus. Electronically Signed   By: Elberta Fortis M.D.   On: 10/06/2020 15:43   DG CHEST PORT 1 VIEW  Result Date: 10/06/2020 CLINICAL DATA:  Shortness of breath. EXAM: PORTABLE CHEST 1 VIEW COMPARISON:  None. FINDINGS: The heart  size and mediastinal contours are within normal limits. Both lungs are clear. The visualized skeletal structures are unremarkable. IMPRESSION: No active disease. Electronically Signed   By: Gerome Sam III M.D   On: 10/06/2020 17:51    Microbiology: Recent Results (from the past 240 hour(s))  Resp Panel by RT-PCR (Flu A&B, Covid) Nasopharyngeal Swab     Status: None    Collection Time: 10/06/20  8:41 PM   Specimen: Nasopharyngeal Swab; Nasopharyngeal(NP) swabs in vial transport medium  Result Value Ref Range Status   SARS Coronavirus 2 by RT PCR NEGATIVE NEGATIVE Final    Comment: (NOTE) SARS-CoV-2 target nucleic acids are NOT DETECTED.  The SARS-CoV-2 RNA is generally detectable in upper respiratory specimens during the acute phase of infection. The lowest concentration of SARS-CoV-2 viral copies this assay can detect is 138 copies/mL. A negative result does not preclude SARS-Cov-2 infection and should not be used as the sole basis for treatment or other patient management decisions. A negative result may occur with  improper specimen collection/handling, submission of specimen other than nasopharyngeal swab, presence of viral mutation(s) within the areas targeted by this assay, and inadequate number of viral copies(<138 copies/mL). A negative result must be combined with clinical observations, patient history, and epidemiological information. The expected result is Negative.  Fact Sheet for Patients:  BloggerCourse.com  Fact Sheet for Healthcare Providers:  SeriousBroker.it  This test is no t yet approved or cleared by the [place] FDA and  has been authorized for detection and/or diagnosis of SARS-CoV-2 by FDA under an Emergency Use Authorization (EUA). This EUA will remain  in effect (meaning this test can be used) for the duration of the COVID-19 declaration under Section 564(b)(1) of the Act, 21 U.S.C.section 360bbb-3(b)(1), unless the authorization is terminated  or revoked sooner.       Influenza A by PCR NEGATIVE NEGATIVE Final   Influenza B by PCR NEGATIVE NEGATIVE Final    Comment: (NOTE) The Xpert Xpress SARS-CoV-2/FLU/RSV plus assay is intended as an aid in the diagnosis of influenza from Nasopharyngeal swab specimens and should not be used as a sole basis for treatment.  Nasal washings and aspirates are unacceptable for Xpert Xpress SARS-CoV-2/FLU/RSV testing.  Fact Sheet for Patients: BloggerCourse.com  Fact Sheet for Healthcare Providers: SeriousBroker.it  This test is not yet approved or cleared by the [place] FDA and has been authorized for detection and/or diagnosis of SARS-CoV-2 by FDA under an Emergency Use Authorization (EUA). This EUA will remain in effect (meaning this test can be used) for the duration of the COVID-19 declaration under Section 564(b)(1) of the Act, 21 U.S.C. section 360bbb-3(b)(1), unless the authorization is terminated or revoked.  Performed at [facility], [address], [place], [place] [address]      Labs: Basic Metabolic Panel: Recent Labs  Lab 10/06/20 1355 10/07/20 0411  NA 139 139  K 3.5 3.7  CL 102 101  CO2 28 29  GLUCOSE 126* 116*  BUN 8 9  CREATININE 0.79 0.82  CALCIUM 9.2 9.2  MG 2.2 2.0  PHOS  --  2.7   Liver Function Tests: Recent Labs  Lab 10/07/20 0411  AST 89*  ALT 173*  ALKPHOS 135*  BILITOT 0.4  PROT 7.4  ALBUMIN 3.8   No results for input(s): LIPASE, AMYLASE in the last 168 hours. No results for input(s): AMMONIA in the last 168 hours. CBC: Recent Labs  Lab 10/06/20 1355 10/07/20 0411  WBC 16.0* 14.4*  HGB  14.5 14.4  HCT 42.1 42.7  MCV 93.8 94.7  PLT 301 289   Cardiac Enzymes: No results for input(s): CKTOTAL, CKMB, CKMBINDEX, TROPONINI in the last 168 hours. BNP: BNP (last 3 results) No results for input(s): BNP in the last 8760 hours.  ProBNP (last 3 results) No results for input(s): PROBNP in the last 8760 hours.  CBG: No results for input(s): GLUCAP in the last 168 hours.     Signed:  Zannie Cove MD.  [facility] 10/07/2020, 1:25 PM

## 2020-10-07 NOTE — Progress Notes (Signed)
Pt iv  Removed per order. Catheter intact upon removal. Skin: clean dry & intact.  AVS provided and reviewed with patient and spouse. All questions answered.   Patient walked around unit per Provider request and denies any symptoms such as dizziness, chest pain, discomfort, etc..   Pt belongings packed  and given to patient by NT Learta Codding.

## 2020-10-07 NOTE — Plan of Care (Signed)
  Problem: Education: Goal: Knowledge of General Education information will improve Description: Including pain rating scale, medication(s)/side effects and non-pharmacologic comfort measures Outcome: Progressing   Problem: Clinical Measurements: Goal: Diagnostic test results will improve Outcome: Progressing   

## 2020-12-23 ENCOUNTER — Emergency Department: Payer: Self-pay

## 2020-12-23 ENCOUNTER — Emergency Department
Admission: EM | Admit: 2020-12-23 | Discharge: 2020-12-24 | Disposition: A | Discharge status: Home / Self Care | Payer: Self-pay | Attending: Emergency Medicine | Admitting: Emergency Medicine

## 2020-12-23 DIAGNOSIS — F10129 Alcohol abuse with intoxication, unspecified: Secondary | ICD-10-CM | POA: Insufficient documentation

## 2020-12-23 DIAGNOSIS — R4182 Altered mental status, unspecified: Secondary | ICD-10-CM | POA: Insufficient documentation

## 2020-12-23 DIAGNOSIS — F10920 Alcohol use, unspecified with intoxication, uncomplicated: Secondary | ICD-10-CM

## 2020-12-23 DIAGNOSIS — F17210 Nicotine dependence, cigarettes, uncomplicated: Secondary | ICD-10-CM | POA: Insufficient documentation

## 2020-12-23 DIAGNOSIS — E876 Hypokalemia: Secondary | ICD-10-CM | POA: Insufficient documentation

## 2020-12-23 DIAGNOSIS — Z79899 Other long term (current) drug therapy: Secondary | ICD-10-CM | POA: Insufficient documentation

## 2020-12-23 DIAGNOSIS — Y908 Blood alcohol level of 240 mg/100 ml or more: Secondary | ICD-10-CM | POA: Insufficient documentation

## 2020-12-23 LAB — CBC WITH DIFFERENTIAL/PLATELET
Abs Immature Granulocytes: 0.02 10*3/uL (ref 0.00–0.07)
Basophils Absolute: 0.1 10*3/uL (ref 0.0–0.1)
Basophils Relative: 1 %
Eosinophils Absolute: 0.2 10*3/uL (ref 0.0–0.5)
Eosinophils Relative: 3 %
HCT: 42.3 % (ref 39.0–52.0)
Hemoglobin: 15.3 g/dL (ref 13.0–17.0)
Immature Granulocytes: 0 %
Lymphocytes Relative: 33 %
Lymphs Abs: 2.5 10*3/uL (ref 0.7–4.0)
MCH: 33.5 pg (ref 26.0–34.0)
MCHC: 36.2 g/dL — ABNORMAL HIGH (ref 30.0–36.0)
MCV: 92.6 fL (ref 80.0–100.0)
Monocytes Absolute: 0.4 10*3/uL (ref 0.1–1.0)
Monocytes Relative: 6 %
Neutro Abs: 4.4 10*3/uL (ref 1.7–7.7)
Neutrophils Relative %: 57 %
Platelets: 329 10*3/uL (ref 150–400)
RBC: 4.57 MIL/uL (ref 4.22–5.81)
RDW: 12.4 % (ref 11.5–15.5)
WBC: 7.6 10*3/uL (ref 4.0–10.5)
nRBC: 0 % (ref 0.0–0.2)

## 2020-12-23 MED ORDER — SODIUM CHLORIDE 0.9 % IV BOLUS
1000.0000 mL | Freq: Once | INTRAVENOUS | Status: AC
  Administered 2020-12-23: 1000 mL via INTRAVENOUS

## 2020-12-23 NOTE — ED Provider Notes (Signed)
[facility] Emergency Department Provider Note   ____________________________________________   Event Date/Time   First MD Initiated Contact with Patient 12/23/20 2306     (approximate)  I have reviewed the triage vital signs and the nursing notes.   HISTORY  Chief Complaint Alcohol Intoxication  Level V caveat: limited by intoxication  HPI Francisco Barrett is a 40 y.o. male brought to the ED by EMS after being found unconscious in the grass by [employer].  History unobtainable secondary to patient intoxication. Moves all extremities, heavily slurring his words     Past Medical History:  Diagnosis Date   H/O ETOH abuse    Tobacco abuse     Patient Active Problem List   Diagnosis Date Noted   Tobacco abuse    Polysubstance abuse (HCC)    Leukocytosis    Acute respiratory failure with hypoxia (HCC) 10/06/2020    No past surgical history on file.  Prior to Admission medications   Medication Sig Start Date End Date Taking? Authorizing Provider  acetaminophen (TYLENOL) 325 MG tablet Take 2 tablets (650 mg total) by mouth every 6 (six) hours as needed for mild pain (or Fever >/= 101). 10/07/20   Zannie Cove, MD    Allergies Patient has no known allergies.  No family history on file.  Social History Social History   Tobacco Use   Smoking status: Every Day    Packs/day: 0.50    Types: Cigarettes   Smokeless tobacco: Never  Vaping Use   Vaping Use: Never used  Substance Use Topics   Alcohol use: Yes    Comment: 2 40's   Drug use: Yes    Types: Marijuana, Cocaine    Review of Systems  Constitutional: No fever/chills Eyes: No visual changes. ENT: No sore throat. Cardiovascular: Denies chest pain. Respiratory: Denies shortness of breath. Gastrointestinal: No abdominal pain.  No nausea, no vomiting.  No diarrhea.  No constipation. Genitourinary: Negative for dysuria. Musculoskeletal: Negative for back pain. Skin: Negative  for rash. Neurological: Negative for headaches, focal weakness or numbness. Psychiatric: Positive for intoxication.  ____________________________________________   PHYSICAL EXAM:  VITAL SIGNS: ED Triage Vitals  Enc Vitals Group     BP 12/23/20 2315 (!) 129/111     Pulse Rate 12/23/20 2311 77     Resp 12/23/20 2315 (!) 23     Temp 12/23/20 2311 97.7 F (36.5 C)     Temp Source 12/23/20 2311 Oral     SpO2 12/23/20 2315 98 %     Weight 12/23/20 2315 128 lb 12.8 oz (58.4 kg)     Height 12/23/20 2315 5\' 3"  (1.6 m)     Head Circumference --      Peak Flow --      Pain Score --      Pain Loc --      Pain Edu? --      Excl. in GC? --     Constitutional: Alert.  Intoxicated appearing and in no acute distress. Eyes: Conjunctivae are reddened bilaterally. PERRL. EOMI. Head: Atraumatic. Nose: Atraumatic. Mouth/Throat: Mucous membranes are moist.  No dental malocclusion. Neck: No stridor.  No cervical spine tenderness to palpation. Cardiovascular: Normal rate, regular rhythm. Grossly normal heart sounds.  Good peripheral circulation. Respiratory: Normal respiratory effort.  No retractions. Lungs CTAB. Gastrointestinal: Soft and nontender to light or deep palpation. No distention. No abdominal bruits. No CVA tenderness. Musculoskeletal: No lower extremity tenderness nor edema.  No joint effusions. Neurologic: Slurred  speech and language. No gross focal neurologic deficits are appreciated.  Skin:  Skin is warm, dry and intact. No rash noted.  No external evidence of injury Psychiatric: Mood and affect are normal. Speech and behavior are normal.  ____________________________________________   LABS (all labs ordered are listed, but only abnormal results are displayed)  Labs Reviewed  CBC WITH DIFFERENTIAL/PLATELET - Abnormal; Notable for the following components:      Result Value   MCHC 36.2 (*)    All other components within normal limits  COMPREHENSIVE METABOLIC PANEL -  Abnormal; Notable for the following components:   Potassium 3.0 (*)    Glucose, Bld 109 (*)    AST 205 (*)    ALT 73 (*)    All other components within normal limits  ETHANOL - Abnormal; Notable for the following components:   Alcohol, Ethyl (B) 403 (*)    All other components within normal limits  LIPASE, BLOOD   ____________________________________________  EKG  ED ECG REPORT I, Kashmir Lysaght J, the attending physician, personally viewed and interpreted this ECG.   Date: 12/23/2020  EKG Time: 2314  Rate: 71  Rhythm: normal sinus rhythm  Axis: Normal  Intervals:none  ST&T Change: Nonspecific  ____________________________________________  RADIOLOGY I, Davine Coba J, personally viewed and evaluated these images (plain radiographs) as part of my medical decision making, as well as reviewing the written report by the radiologist.  ED MD interpretation: No ICH, no cervical spine injury, no acute cardiopulmonary process  Official radiology report(s): CT Head Wo Contrast  Result Date: 12/24/2020 CLINICAL DATA:  Altered mental status, slurring words EXAM: CT HEAD WITHOUT CONTRAST CT CERVICAL SPINE WITHOUT CONTRAST TECHNIQUE: Multidetector CT imaging of the head and cervical spine was performed following the standard protocol without intravenous contrast. Multiplanar CT image reconstructions of the cervical spine were also generated. COMPARISON:  CT head dated 10/06/2020 FINDINGS: CT HEAD FINDINGS Brain: No evidence of acute infarction, hemorrhage, hydrocephalus, extra-axial collection or mass lesion/mass effect. Vascular: No hyperdense vessel or unexpected calcification. Skull: Normal. Negative for fracture or focal lesion. Sinuses/Orbits: Near-complete opacification of the right maxillary sinus. Visualized paranasal sinuses and mastoid air cells are otherwise clear. Other: None. CT CERVICAL SPINE FINDINGS Alignment: Straightening of the cervical spine, likely positional. Skull base and  vertebrae: No acute fracture. No primary bone lesion or focal pathologic process. Soft tissues and spinal canal: No prevertebral fluid or swelling. No visible canal hematoma. Disc levels: Intervertebral disc spaces are preserved. Spinal canal is patent. Upper chest: Visualized lung apices are clear. Other: Visualized thyroid is unremarkable. IMPRESSION: Normal head CT. Normal cervical spine CT. Electronically Signed   By: Charline Bills M.D.   On: 12/24/2020 00:31   CT Cervical Spine Wo Contrast  Result Date: 12/24/2020 CLINICAL DATA:  Altered mental status, slurring words EXAM: CT HEAD WITHOUT CONTRAST CT CERVICAL SPINE WITHOUT CONTRAST TECHNIQUE: Multidetector CT imaging of the head and cervical spine was performed following the standard protocol without intravenous contrast. Multiplanar CT image reconstructions of the cervical spine were also generated. COMPARISON:  CT head dated 10/06/2020 FINDINGS: CT HEAD FINDINGS Brain: No evidence of acute infarction, hemorrhage, hydrocephalus, extra-axial collection or mass lesion/mass effect. Vascular: No hyperdense vessel or unexpected calcification. Skull: Normal. Negative for fracture or focal lesion. Sinuses/Orbits: Near-complete opacification of the right maxillary sinus. Visualized paranasal sinuses and mastoid air cells are otherwise clear. Other: None. CT CERVICAL SPINE FINDINGS Alignment: Straightening of the cervical spine, likely positional. Skull base and vertebrae: No acute fracture. No primary  bone lesion or focal pathologic process. Soft tissues and spinal canal: No prevertebral fluid or swelling. No visible canal hematoma. Disc levels: Intervertebral disc spaces are preserved. Spinal canal is patent. Upper chest: Visualized lung apices are clear. Other: Visualized thyroid is unremarkable. IMPRESSION: Normal head CT. Normal cervical spine CT. Electronically Signed   By: Charline Bills M.D.   On: 12/24/2020 00:31   DG Chest Port 1 View  Result  Date: 12/23/2020 CLINICAL DATA:  Unconscious slurring EXAM: PORTABLE CHEST 1 VIEW COMPARISON:  10/06/2020 FINDINGS: Low lung volumes. No focal opacity or pleural effusion. Cardiac silhouette upper limits of normal. No pneumothorax IMPRESSION: No active disease.  Low lung volumes Electronically Signed   By: Jasmine Pang M.D.   On: 12/23/2020 23:49    ____________________________________________   PROCEDURES  Procedure(s) performed (including Critical Care):  .1-3 Lead EKG Interpretation  Date/Time: 12/23/2020 11:30 PM Performed by: Irean Hong, MD Authorized by: Irean Hong, MD     Interpretation: normal     ECG rate:  70   ECG rate assessment: normal     Rhythm: sinus rhythm     Ectopy: none     Conduction: normal   Comments:     Patient placed on cardiac monitor to evaluate for arrhythmias   ____________________________________________   INITIAL IMPRESSION / ASSESSMENT AND PLAN / ED COURSE  As part of my medical decision making, I reviewed the following data within the electronic MEDICAL RECORD NUMBER Nursing notes reviewed and incorporated, Labs reviewed, EKG interpreted, Old chart reviewed, Radiograph reviewed, and Notes from prior ED visits     40 year old male presenting with intoxication, found unconscious in the grass.  Differential diagnosis includes, but is not limited to, alcohol, illicit or prescription medications, or other toxic ingestion; intracranial pathology such as stroke or intracerebral hemorrhage; fever or infectious causes including sepsis; hypoxemia and/or hypercarbia; uremia; trauma; endocrine related disorders such as diabetes, hypoglycemia, and thyroid-related diseases; hypertensive encephalopathy; etc.   Will obtain toxicological lab work, CT head; initiate IV fluid resuscitation and reassess  Clinical Course as of 12/24/20 0702  Tue Dec 24, 2020  0034 CT imaging studies unremarkable.  Patient resting in no acute distress.  EtOH greater than 400.  Will  continue to monitor. [JS]  [id number] Patient attempting to get out of bed.  Belligerent, cursing.  Given IV Ativan for calming. [JS]  0620 No events overnight.  Patient calm down and slept after IV Ativan.  Still sleeping.  Anticipate discharge home later this morning once patient is sober and ambulatory. [JS]  0700 Care transferred to Dr. Darnelle Catalan at change of shift.  Anticipate patient may be discharged home once he is sober and ambulatory. [JS]    Clinical Course User Index [JS] Irean Hong, MD     ____________________________________________   FINAL CLINICAL IMPRESSION(S) / ED DIAGNOSES  Final diagnoses:  Alcoholic intoxication without complication (HCC)  Hypokalemia     ED Discharge Orders     None        Note:  This document was prepared using Dragon voice recognition software and may include unintentional dictation errors.    Irean Hong, MD 12/24/20 [id number]

## 2020-12-23 NOTE — ED Triage Notes (Addendum)
Pt BIBA after being found unconscious in grass by [employer]. Pt heavily slurring words, not cooperative with triage process.   Pt continues to repeat "I want bad bitches".

## 2020-12-24 LAB — COMPREHENSIVE METABOLIC PANEL
ALT: 73 U/L — ABNORMAL HIGH (ref 0–44)
AST: 205 U/L — ABNORMAL HIGH (ref 15–41)
Albumin: 4.4 g/dL (ref 3.5–5.0)
Alkaline Phosphatase: 67 U/L (ref 38–126)
Anion gap: 11 (ref 5–15)
BUN: 15 mg/dL (ref 6–20)
CO2: 24 mmol/L (ref 22–32)
Calcium: 9.2 mg/dL (ref 8.9–10.3)
Chloride: 105 mmol/L (ref 98–111)
Creatinine, Ser: 0.87 mg/dL (ref 0.61–1.24)
GFR, Estimated: >60 mL/min (ref >60)
Glucose, Bld: 109 mg/dL — ABNORMAL HIGH (ref 70–99)
Potassium: 3 mmol/L — ABNORMAL LOW (ref 3.5–5.1)
Sodium: 140 mmol/L (ref 135–145)
Total Bilirubin: 0.5 mg/dL (ref 0.3–1.2)
Total Protein: 7.7 g/dL (ref 6.5–8.1)

## 2020-12-24 LAB — ETHANOL: Alcohol, Ethyl (B): 403 mg/dL (ref <10)

## 2020-12-24 LAB — LIPASE, BLOOD: Lipase: 35 U/L (ref 11–51)

## 2020-12-24 MED ORDER — LORAZEPAM 2 MG/ML IJ SOLN
INTRAMUSCULAR | Status: AC
  Administered 2020-12-24: 1 mg via INTRAVENOUS
  Filled 2020-12-24: qty 1

## 2020-12-24 MED ORDER — POTASSIUM CHLORIDE CRYS ER 20 MEQ PO TBCR: 40.0000 meq | EXTENDED_RELEASE_TABLET | Freq: Once | ORAL | Status: DC

## 2020-12-24 MED ORDER — LORAZEPAM 2 MG/ML IJ SOLN: 1.0000 mg | Freq: Once | INTRAMUSCULAR | Status: AC

## 2020-12-24 NOTE — ED Notes (Signed)
MD at bedside to assess.

## 2020-12-24 NOTE — ED Notes (Signed)
Pt given sandiwch box and water per request

## 2020-12-24 NOTE — ED Notes (Signed)
MD aware of ETOH 403

## 2020-12-24 NOTE — ED Notes (Signed)
Pt NAD, a/ox4. Pt verbalizes understanding of all DC and f/u instructions. All questions answered. Pt walks with steady gait to lobby at DC.  ? ?

## 2020-12-24 NOTE — ED Notes (Addendum)
Patient unable to safely ambulate at this time without maximum assistance, unsteady gait to bedside toilet. Limited understanding of situation, still appears intoxicated. Pt returned to stretcher with vital sign monitoring in place.  [facility] notified.

## 2020-12-24 NOTE — ED Provider Notes (Signed)
Patient now awake and alert clinically sober and able to walk without difficulty.  We will discharge him.   Arnaldo Natal, MD 12/24/20 1350

## 2020-12-24 NOTE — ED Notes (Signed)
Report given to oncoming RN all questions answered

## 2020-12-24 NOTE — Discharge Instructions (Signed)
Drink alcohol only in moderation.  Return to the ER for worsening symptoms, persistent vomiting, lethargy or other concerns 

## 2021-05-05 ENCOUNTER — Other Ambulatory Visit: Payer: Self-pay

## 2021-05-05 ENCOUNTER — Emergency Department: Payer: Self-pay

## 2021-05-05 ENCOUNTER — Emergency Department
Admission: EM | Admit: 2021-05-05 | Discharge: 2021-05-05 | Disposition: A | Discharge status: Home / Self Care | Payer: Self-pay | Attending: Emergency Medicine | Admitting: Emergency Medicine

## 2021-05-05 DIAGNOSIS — R200 Anesthesia of skin: Secondary | ICD-10-CM

## 2021-05-05 DIAGNOSIS — R208 Other disturbances of skin sensation: Secondary | ICD-10-CM | POA: Insufficient documentation

## 2021-05-05 DIAGNOSIS — M79601 Pain in right arm: Secondary | ICD-10-CM | POA: Insufficient documentation

## 2021-05-05 LAB — BASIC METABOLIC PANEL
Anion gap: 9 (ref 5–15)
BUN: 15 mg/dL (ref 6–20)
CO2: 25 mmol/L (ref 22–32)
Calcium: 9 mg/dL (ref 8.9–10.3)
Chloride: 107 mmol/L (ref 98–111)
Creatinine, Ser: 0.82 mg/dL (ref 0.61–1.24)
GFR, Estimated: >60 mL/min (ref >60)
Glucose, Bld: 81 mg/dL (ref 70–99)
Potassium: 3.8 mmol/L (ref 3.5–5.1)
Sodium: 141 mmol/L (ref 135–145)

## 2021-05-05 LAB — CBC WITH DIFFERENTIAL/PLATELET
Abs Immature Granulocytes: 0.02 10*3/uL (ref 0.00–0.07)
Basophils Absolute: 0 10*3/uL (ref 0.0–0.1)
Basophils Relative: 1 %
Eosinophils Absolute: 0.1 10*3/uL (ref 0.0–0.5)
Eosinophils Relative: 2 %
HCT: 40.2 % (ref 39.0–52.0)
Hemoglobin: 13.9 g/dL (ref 13.0–17.0)
Immature Granulocytes: 0 %
Lymphocytes Relative: 22 %
Lymphs Abs: 1.4 10*3/uL (ref 0.7–4.0)
MCH: 32.9 pg (ref 26.0–34.0)
MCHC: 34.6 g/dL (ref 30.0–36.0)
MCV: 95 fL (ref 80.0–100.0)
Monocytes Absolute: 0.4 10*3/uL (ref 0.1–1.0)
Monocytes Relative: 6 %
Neutro Abs: 4.3 10*3/uL (ref 1.7–7.7)
Neutrophils Relative %: 69 %
Platelets: 288 10*3/uL (ref 150–400)
RBC: 4.23 MIL/uL (ref 4.22–5.81)
RDW: 12.3 % (ref 11.5–15.5)
WBC: 6.3 10*3/uL (ref 4.0–10.5)
nRBC: 0 % (ref 0.0–0.2)

## 2021-05-05 NOTE — ED Notes (Signed)
Pt states his right arm has been numb and tingling for 2 months.

## 2021-05-05 NOTE — Discharge Instructions (Signed)
Please seek medical attention for any high fevers, chest pain, shortness of breath, change in behavior, persistent vomiting, bloody stool or any other new or concerning symptoms.  

## 2021-05-05 NOTE — ED Provider Notes (Signed)
[facility] Provider Note    Event Date/Time   First MD Initiated Contact with Patient 05/05/21 1701     (approximate)   History   Arm Pain   HPI  Francisco Barrett is a 41 y.o. male  who per discharge summary dated 10/07/20 has history of polysubstance abuse and mildly elevated blood sugar, presents to the emergency department today because of concerns for right arm weakness.  Patient states he first noticed problems with his right arm roughly 2 months ago.  He noticed them upon awakening.  He says since then the weakness and on sensation to his hand has persisted.  He does find it harder to perform his work tasks.  He denies any associated pain.  Does not has any neck pain or headache.  Additionally he says that sometimes he gets pain and numbness in his feet.  He does wonder if he has diabetes since it runs in his family.  Physical Exam   Triage Vital Signs: ED Triage Vitals  Enc Vitals Group     BP 05/05/21 1354 (!) 132/104     Pulse Rate 05/05/21 1354 69     Resp 05/05/21 1354 17     Temp 05/05/21 1354 98 F (36.7 C)     Temp src --      SpO2 05/05/21 1354 100 %     Weight --      Height --      Head Circumference --      Peak Flow --      Pain Score 05/05/21 1351 4   Most recent vital signs: Vitals:   05/05/21 1707 05/05/21 1716  BP: 138/66 138/66  Pulse: 66 66  Resp:  17  Temp:    SpO2: 97% 97%    General: Awake, no distress.  CV:  Good peripheral perfusion. RP 2+ bilaterally Resp:  Normal effort.  Abd:  No distention.  MSK:  No spinal tenderness. No right arm deformity. Neuro:  Grip strength 5/5 bilaterally. Subjective altered sensation of the right hand.  ED Results / Procedures / Treatments   Labs (all labs ordered are listed, but only abnormal results are displayed) Labs Reviewed  CBC WITH DIFFERENTIAL/PLATELET  BASIC METABOLIC PANEL     EKG  None   RADIOLOGY CT head My interpretation: No bleed. No  mass Radiology interpretation:  IMPRESSION  No acute intracranial process.       PROCEDURES:  Critical Care performed: No  Procedures   MEDICATIONS ORDERED IN ED: Medications - No data to display   IMPRESSION / MDM / ASSESSMENT AND PLAN / ED COURSE  I reviewed the triage vital signs and the nursing notes.                              Differential diagnosis includes, but is not limited to, stroke, diabetic neuropathy, peripheral neuropathy, electrolyte abnormality.  Patient presented to the emergency department today with concerns for right arm numbness and some weakness.  Patient states symptoms have been present for the past 2 months.  Denies any significant pain.  On exam no appreciable weakness on grip strength.  Some subjective change in sensation.  Did obtain a head CT which did not show any stroke.  At this time I would think patient had suffered CVA we would pick it up on CT given that its been 2 months.  Blood work without elevation of the glucose  and no concerning electrolyte abnormalities.  No elevated white blood cell count to suggest significant inflammation or infection.  This time somewhat yet unclear etiology of the patient's symptoms.  Do however feel the patient does not require admission given negative work-up here.  Will have patient follow-up with neurology. Discussed findings and plan with patient.    FINAL CLINICAL IMPRESSION(S) / ED DIAGNOSES   Final diagnoses:  Right arm numbness      Note:  This document was prepared using Dragon voice recognition software and may include unintentional dictation errors.    Phineas Semen, MD 05/05/21 2145

## 2021-05-05 NOTE — ED Provider Triage Note (Signed)
Emergency Medicine Provider Triage Evaluation Note  Aries Townley , a 41 y.o. male  was evaluated in triage.  Pt complains of right arm tingling and numbness ongoing for the past month. Works in [employer]. No alleviating measures prior to arrival.  Review of Systems  Positive: Grip strength equal Negative: Skin wound or lesion  Physical Exam  BP (!) 132/104    Pulse 69    Temp 98 F (36.7 C)    Resp 17    SpO2 100%  Gen:   Awake, no distress   Resp:  Normal effort  MSK:   Moves extremities without difficulty  Other:    Medical Decision Making  Medically screening exam initiated at 1:59 PM.  Appropriate orders placed.  Dev Jaeceon Michelin was informed that the remainder of the evaluation will be completed by another provider, this initial triage assessment does not replace that evaluation, and the importance of remaining in the ED until their evaluation is complete.   Chinita Pester, FNP 05/05/21 1402

## 2021-05-05 NOTE — ED Triage Notes (Signed)
Pt comes with c/o right arm pain. Pt states this has been going on for about a month. Pt state he was at work and it just went numb. Pt denies any CP.

## 2021-05-16 ENCOUNTER — Emergency Department
Admission: EM | Admit: 2021-05-16 | Discharge: 2021-05-16 | Disposition: A | Discharge status: Home / Self Care | Payer: Self-pay | Attending: Emergency Medicine | Admitting: Emergency Medicine

## 2021-05-16 ENCOUNTER — Other Ambulatory Visit: Payer: Self-pay

## 2021-05-16 DIAGNOSIS — G8929 Other chronic pain: Secondary | ICD-10-CM | POA: Insufficient documentation

## 2021-05-16 DIAGNOSIS — M79662 Pain in left lower leg: Secondary | ICD-10-CM | POA: Insufficient documentation

## 2021-05-16 DIAGNOSIS — M79605 Pain in left leg: Secondary | ICD-10-CM

## 2021-05-16 DIAGNOSIS — M79621 Pain in right upper arm: Secondary | ICD-10-CM | POA: Insufficient documentation

## 2021-05-16 DIAGNOSIS — R200 Anesthesia of skin: Secondary | ICD-10-CM | POA: Insufficient documentation

## 2021-05-16 MED ORDER — IBUPROFEN 800 MG PO TABS: 800.0000 mg | ORAL_TABLET | Freq: Once | ORAL | Status: DC

## 2021-05-16 MED ORDER — ACETAMINOPHEN 325 MG PO TABS
650.0000 mg | ORAL_TABLET | Freq: Once | ORAL | Status: AC
  Administered 2021-05-16: 650 mg via ORAL
  Filled 2021-05-16: qty 2

## 2021-05-16 MED ORDER — IBUPROFEN 800 MG PO TABS
800.0000 mg | ORAL_TABLET | Freq: Once | ORAL | Status: AC
  Administered 2021-05-16: 800 mg via ORAL

## 2021-05-16 MED ORDER — IBUPROFEN 800 MG PO TABS
ORAL_TABLET | ORAL | Status: AC
  Filled 2021-05-16: qty 1

## 2021-05-16 NOTE — ED Notes (Signed)
EDPA into room 

## 2021-05-16 NOTE — ED Notes (Addendum)
Pt alert, NAD, calm, interactive, resps e/u, speaking in clear complete sentences. C/o vague R arm and L leg pain, describes as numbness type pain, and rates 8/10. Denies injury or fall. Onset last night. Slightly improved after ibuprofen at triage. MAEx4. CMS intact. Denies need for or use of cane or walker.
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
## 2021-05-16 NOTE — ED Notes (Signed)
Pt jumped out of bed, dressed self and ambulated with steady gait.
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
## 2021-05-27 ENCOUNTER — Other Ambulatory Visit: Payer: Self-pay

## 2021-05-27 ENCOUNTER — Emergency Department
Admission: EM | Admit: 2021-05-27 | Discharge: 2021-05-27 | Disposition: A | Discharge status: Home / Self Care | Payer: Self-pay | Attending: Emergency Medicine | Admitting: Emergency Medicine

## 2021-05-27 DIAGNOSIS — R200 Anesthesia of skin: Secondary | ICD-10-CM | POA: Insufficient documentation

## 2021-05-27 MED ORDER — PREDNISONE 10 MG (21) PO TBPK: ORAL_TABLET | ORAL | 0 refills | Status: DC

## 2021-05-27 NOTE — ED Notes (Addendum)
See triage note  presents with some right arm numbness  states he noticed this a few weeks ago  states feels like it is getting worse   grips equal  ambulates well

## 2021-05-27 NOTE — ED Provider Notes (Signed)
[facility] Provider Note    Event Date/Time   First MD Initiated Contact with Patient 05/27/21 [phone]     (approximate)   History   Numbness   HPI  Francisco Barrett is a 41 y.o. male presents emergency department with 6-month history of numbness to the right arm.  Patient works [employer] and does repetitive motions.  States that the numbness is moved up the arm a little bit.  No redness or swelling.  States every now then he will lose his grip.  No neck pain.  No known injury.      Physical Exam   Triage Vital Signs: ED Triage Vitals  Enc Vitals Group     BP 05/27/21 0238 (!) 149/103     Pulse Rate 05/27/21 0238 71     Resp 05/27/21 0238 15     Temp 05/27/21 0238 98 F (36.7 C)     Temp Source 05/27/21 0238 Oral     SpO2 05/27/21 0238 98 %     Weight 05/27/21 0239 127 lb 13.9 oz (58 kg)     Height 05/27/21 0239 5\' 3"  (1.6 m)     Head Circumference --      Peak Flow --      Pain Score 05/27/21 0239 8     Pain Loc --      Pain Edu? --      Excl. in GC? --     Most recent vital signs: Vitals:   05/27/21 0238  BP: (!) 149/103  Pulse: 71  Resp: 15  Temp: 98 F (36.7 C)  SpO2: 98%     General: Awake, no distress.   CV:  Good peripheral perfusion. regular rate and  rhythm Resp:  Normal effort.  Abd:  No distention.   Other:  Right arm is nontender, grips are equal bilaterally, patient is able to feel soft and sharp touch on the right upper extremity, neurovascular is intact   ED Results / Procedures / Treatments   Labs (all labs ordered are listed, but only abnormal results are displayed) Labs Reviewed - No data to display   EKG     RADIOLOGY     PROCEDURES:   Procedures   MEDICATIONS ORDERED IN ED: Medications - No data to display   IMPRESSION / MDM / ASSESSMENT AND PLAN / ED COURSE  I reviewed the triage vital signs and the nursing notes.                              Differential diagnosis  includes, but is not limited to, median nerve compression, anterior osseous injury, DVT  I did explain all the findings to the patient.  I do not feel he has blood clots there is no swelling and no redness, no recent IV or IV drug use.  Due to the repetitive motion at work I do feel that it is more of a compression on the nerve.  Started him on a steroid pack to try and decrease inflammation.  He is to follow-up with orthopedics.  I informed him he may need a nerve conduction study.  Patient is in agreement treatment plan.  He was discharged stable condition.         FINAL CLINICAL IMPRESSION(S) / ED DIAGNOSES   Final diagnoses:  Numbness     Rx / DC Orders   ED Discharge Orders  Ordered    predniSONE (STERAPRED UNI-PAK 21 TAB) 10 MG (21) TBPK tablet        05/27/21 4765             Note:  This document was prepared using Dragon voice recognition software and may include unintentional dictation errors.    Faythe Ghee, PA-C 05/27/21 4650    Jene Every, MD 05/27/21 [phone]

## 2021-05-27 NOTE — ED Triage Notes (Signed)
Pt presents to ER c/o right arm numbness that has been going on for a few weeks but has been getting worse.  Pt states it feels like the numbness seems to be going further up his arm.  Pt states he wanted to come get it checked out because he is scared and it isnt feeling right.  No other neurological deficits noted. NIH 1.

## 2021-05-27 NOTE — Discharge Instructions (Addendum)
Follow-up with [facility] for an evaluation.  They may need to do a nerve conduction study to see where the numbness originates from.  Take the Sterapred as prescribed.  Apply ice to the inner aspect of the elbow.  Return emergency department if worsening

## 2021-06-18 ENCOUNTER — Inpatient Hospital Stay
Admission: AD | Admit: 2021-06-18 | Discharge: 2021-06-24 | DRG: 885 | Disposition: A | Discharge status: Home / Self Care | Payer: 59 | Source: Intra-hospital | Attending: Psychiatry | Admitting: Psychiatry

## 2021-06-18 ENCOUNTER — Emergency Department
Admission: EM | Admit: 2021-06-18 | Discharge: 2021-06-18 | Disposition: A | Discharge status: Other Acute Inpatient Hospital | Payer: Self-pay | Attending: Emergency Medicine | Admitting: Emergency Medicine

## 2021-06-18 ENCOUNTER — Other Ambulatory Visit: Payer: Self-pay

## 2021-06-18 DIAGNOSIS — I10 Essential (primary) hypertension: Secondary | ICD-10-CM | POA: Diagnosis present

## 2021-06-18 DIAGNOSIS — R45851 Suicidal ideations: Secondary | ICD-10-CM | POA: Diagnosis present

## 2021-06-18 DIAGNOSIS — Z20822 Contact with and (suspected) exposure to covid-19: Secondary | ICD-10-CM | POA: Diagnosis present

## 2021-06-18 DIAGNOSIS — F1010 Alcohol abuse, uncomplicated: Secondary | ICD-10-CM | POA: Diagnosis present

## 2021-06-18 DIAGNOSIS — F1210 Cannabis abuse, uncomplicated: Secondary | ICD-10-CM | POA: Insufficient documentation

## 2021-06-18 DIAGNOSIS — F332 Major depressive disorder, recurrent severe without psychotic features: Principal | ICD-10-CM | POA: Diagnosis present

## 2021-06-18 DIAGNOSIS — G4700 Insomnia, unspecified: Secondary | ICD-10-CM | POA: Diagnosis present

## 2021-06-18 DIAGNOSIS — F1910 Other psychoactive substance abuse, uncomplicated: Secondary | ICD-10-CM | POA: Insufficient documentation

## 2021-06-18 DIAGNOSIS — F17210 Nicotine dependence, cigarettes, uncomplicated: Secondary | ICD-10-CM | POA: Diagnosis present

## 2021-06-18 DIAGNOSIS — F1410 Cocaine abuse, uncomplicated: Secondary | ICD-10-CM | POA: Diagnosis present

## 2021-06-18 DIAGNOSIS — Z79899 Other long term (current) drug therapy: Secondary | ICD-10-CM

## 2021-06-18 DIAGNOSIS — F329 Major depressive disorder, single episode, unspecified: Secondary | ICD-10-CM | POA: Insufficient documentation

## 2021-06-18 DIAGNOSIS — Y906 Blood alcohol level of 120-199 mg/100 ml: Secondary | ICD-10-CM | POA: Insufficient documentation

## 2021-06-18 DIAGNOSIS — F1510 Other stimulant abuse, uncomplicated: Secondary | ICD-10-CM | POA: Insufficient documentation

## 2021-06-18 DIAGNOSIS — D72829 Elevated white blood cell count, unspecified: Secondary | ICD-10-CM | POA: Insufficient documentation

## 2021-06-18 DIAGNOSIS — J9621 Acute and chronic respiratory failure with hypoxia: Secondary | ICD-10-CM | POA: Insufficient documentation

## 2021-06-18 DIAGNOSIS — F17200 Nicotine dependence, unspecified, uncomplicated: Secondary | ICD-10-CM | POA: Insufficient documentation

## 2021-06-18 LAB — COMPREHENSIVE METABOLIC PANEL
ALT: 25 U/L (ref 0–44)
AST: 24 U/L (ref 15–41)
Albumin: 4.4 g/dL (ref 3.5–5.0)
Alkaline Phosphatase: 61 U/L (ref 38–126)
Anion gap: 13 (ref 5–15)
BUN: 15 mg/dL (ref 6–20)
CO2: 23 mmol/L (ref 22–32)
Calcium: 9.2 mg/dL (ref 8.9–10.3)
Chloride: 105 mmol/L (ref 98–111)
Creatinine, Ser: 0.88 mg/dL (ref 0.61–1.24)
GFR, Estimated: >60 mL/min (ref >60)
Glucose, Bld: 104 mg/dL — ABNORMAL HIGH (ref 70–99)
Potassium: 4.1 mmol/L (ref 3.5–5.1)
Sodium: 141 mmol/L (ref 135–145)
Total Bilirubin: 0.4 mg/dL (ref 0.3–1.2)
Total Protein: 7.8 g/dL (ref 6.5–8.1)

## 2021-06-18 LAB — CBC
HCT: 42.5 % (ref 39.0–52.0)
Hemoglobin: 14.2 g/dL (ref 13.0–17.0)
MCH: 32.4 pg (ref 26.0–34.0)
MCHC: 33.4 g/dL (ref 30.0–36.0)
MCV: 97 fL (ref 80.0–100.0)
Platelets: 326 10*3/uL (ref 150–400)
RBC: 4.38 MIL/uL (ref 4.22–5.81)
RDW: 12 % (ref 11.5–15.5)
WBC: 7.3 10*3/uL (ref 4.0–10.5)
nRBC: 0 % (ref 0.0–0.2)

## 2021-06-18 LAB — URINE DRUG SCREEN, QUALITATIVE (ARMC ONLY)
Amphetamines, Ur Screen: POSITIVE — AB
Barbiturates, Ur Screen: NOT DETECTED
Benzodiazepine, Ur Scrn: NOT DETECTED
Cannabinoid 50 Ng, Ur ~~LOC~~: POSITIVE — AB
Cocaine Metabolite,Ur ~~LOC~~: NOT DETECTED
MDMA (Ecstasy)Ur Screen: NOT DETECTED
Methadone Scn, Ur: NOT DETECTED
Opiate, Ur Screen: NOT DETECTED
Phencyclidine (PCP) Ur S: NOT DETECTED
Tricyclic, Ur Screen: NOT DETECTED

## 2021-06-18 LAB — ETHANOL: Alcohol, Ethyl (B): 175 mg/dL — ABNORMAL HIGH (ref <10)

## 2021-06-18 LAB — RESP PANEL BY RT-PCR (FLU A&B, COVID) ARPGX2
Influenza A by PCR: NEGATIVE
Influenza B by PCR: NEGATIVE
SARS Coronavirus 2 by RT PCR: NEGATIVE

## 2021-06-18 LAB — ACETAMINOPHEN LEVEL: Acetaminophen (Tylenol), Serum: <10 ug/mL — ABNORMAL LOW (ref 10–30)

## 2021-06-18 LAB — SALICYLATE LEVEL: Salicylate Lvl: <7 mg/dL — ABNORMAL LOW (ref 7.0–30.0)

## 2021-06-18 MED ORDER — LORAZEPAM 2 MG/ML IJ SOLN: 1.0000 mg | INTRAMUSCULAR | Status: DC | PRN

## 2021-06-18 MED ORDER — LORAZEPAM 1 MG PO TABS: 1.0000 mg | ORAL_TABLET | Freq: Four times a day (QID) | ORAL | Status: AC | PRN

## 2021-06-18 MED ORDER — ACETAMINOPHEN 325 MG PO TABS: 650.0000 mg | ORAL_TABLET | Freq: Four times a day (QID) | ORAL | Status: DC | PRN

## 2021-06-18 MED ORDER — ALUM & MAG HYDROXIDE-SIMETH 200-200-20 MG/5ML PO SUSP: 30.0000 mL | ORAL | Status: DC | PRN

## 2021-06-18 MED ORDER — ADULT MULTIVITAMIN W/MINERALS CH
1.0000 | ORAL_TABLET | Freq: Every day | ORAL | Status: DC
  Administered 2021-06-18: 1 via ORAL
  Filled 2021-06-18 (×2): qty 1

## 2021-06-18 MED ORDER — THIAMINE HCL 100 MG/ML IJ SOLN
100.0000 mg | Freq: Every day | INTRAMUSCULAR | Status: DC
  Filled 2021-06-18: qty 1

## 2021-06-18 MED ORDER — LORAZEPAM 1 MG PO TABS
1.0000 mg | ORAL_TABLET | ORAL | Status: DC | PRN
  Administered 2021-06-18: 3 mg via ORAL
  Filled 2021-06-18: qty 3

## 2021-06-18 MED ORDER — MAGNESIUM HYDROXIDE 400 MG/5ML PO SUSP
30.0000 mL | Freq: Every day | ORAL | Status: DC | PRN
Start: 2021-06-18 — End: 2021-06-24

## 2021-06-18 MED ORDER — LORAZEPAM 1 MG PO TABS
1.0000 mg | ORAL_TABLET | Freq: Once | ORAL | Status: AC
  Administered 2021-06-18: 1 mg via ORAL
  Filled 2021-06-18: qty 1

## 2021-06-18 MED ORDER — FOLIC ACID 1 MG PO TABS
1.0000 mg | ORAL_TABLET | Freq: Every day | ORAL | Status: DC
  Administered 2021-06-18: 1 mg via ORAL
  Filled 2021-06-18 (×2): qty 1

## 2021-06-18 MED ORDER — ADULT MULTIVITAMIN W/MINERALS CH
1.0000 | ORAL_TABLET | Freq: Every day | ORAL | Status: DC
  Administered 2021-06-19 – 2021-06-24 (×6): 1 via ORAL
  Filled 2021-06-18 (×6): qty 1

## 2021-06-18 MED ORDER — FOLIC ACID 1 MG PO TABS
1.0000 mg | ORAL_TABLET | Freq: Every day | ORAL | Status: DC
  Administered 2021-06-19 – 2021-06-24 (×6): 1 mg via ORAL
  Filled 2021-06-18 (×6): qty 1

## 2021-06-18 MED ORDER — THIAMINE HCL 100 MG PO TABS
100.0000 mg | ORAL_TABLET | Freq: Every day | ORAL | Status: DC
  Administered 2021-06-18: 100 mg via ORAL
  Filled 2021-06-18 (×2): qty 1

## 2021-06-18 NOTE — BH Assessment (Signed)
Patient is to be admitted to [facility] BMU tonight 06/18/21 after 7:30pm by Dr. Toni Amend.  Attending Physician will be Dr.  Toni Amend .   Patient has been assigned to room 323, by [facility] Charge Nurse, Ivonne Andrew.     ER staff is aware of the admission: [place], ER Secretary   Dr. Lenard Lance, ER MD  Florentina Addison, Patient's Nurse  Sue Lush, Patient Access.

## 2021-06-18 NOTE — ED Triage Notes (Signed)
Pt arrived via [employer], pt was found in the middle of the road states he was trying to commit suicide.  No officer present at this time, Probation officer did not speak with [place].   Pt refusing lab draw, pt stated " I thought I was in [facility]"  Pt admits to drinking alcohol.
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
## 2021-06-18 NOTE — BH Assessment (Signed)
Comprehensive Clinical Assessment (CCA) Note  06/18/2021 Francisco Barrett [id number]  Francisco Barrett, 41 year old male who presents to [facility] ED involuntarily for treatment. Per triage note, Pt arrived via [place] voluntary, pt was found in the middle of the road states he was trying to commit suicide. No officer present at this time, Probation officer did not speak with [place]. Pt refusing lab draw, pt stated " I thought I was in [facility]". Pt admits to drinking alcohol.   During TTS assessment pt presents alert and oriented x 4, restless but cooperative, and mood-congruent with affect. The pt does not appear to be responding to internal or external stimuli. Neither is the pt presenting with any delusional thinking. Pt verified the information provided to triage RN.   Pt identifies his main complaint to be that he is fed up with life due to several stressors. Patient reports he is severely depressed, isolates himself from others and is struggling to deal with his current living situation. Patient reports he is homeless and does not have the support of his family. Patient reports he is not taking medications now but wants to try something, so it is easier to cope. Pt admits to drinking 12 pack of beer daily and will occasionally use cocaine and marijuana. Its a hit or miss. Patient reports poor sleep as well as poor eating habits. Patient believes inpatient treatment would be beneficial. Pt reports having no INPT or OPT hx. Pt denies HI/AH/VH. Patient    Per Barbaraann Share, NP pt is recommended for inpatient psychiatric admission.    Chief Complaint:  Chief Complaint  Patient presents with   Suicidal   Visit Diagnosis: Major depressive disorder    CCA Screening, Triage and Referral (STR)  Patient Reported Information How did you hear about [place]? -- Risk manager)  Referral name: No data recorded Referral phone number: No data recorded  Whom do you see for routine medical problems? No data  recorded Practice/Facility Name: No data recorded Practice/Facility Phone Number: No data recorded Name of Contact: No data recorded Contact Number: No data recorded Contact Fax Number: No data recorded Prescriber Name: No data recorded Prescriber Address (if known): No data recorded  What Is the Reason for Your Visit/Call Today? Patient was brought to ED for suicide attempt.  How Long Has This Been Causing You Problems? <Week  What Do You Feel Would Help You the Most Today? Housing Assistance; Stress Management; Social Support; Alcohol or Drug Use Treatment; Treatment for Depression or other mood problem   Have You Recently Been in Any Inpatient Treatment (Hospital/Detox/Crisis Center/28-Day Program)? No data recorded Name/Location of Program/Hospital:No data recorded How Long Were You There? No data recorded When Were You Discharged? No data recorded  Have You Ever Received Services From [facility] Before? No data recorded Who Do You See at [facility]? No data recorded  Have You Recently Had Any Thoughts About Hurting Yourself? Yes  Are You Planning to Commit Suicide/Harm Yourself At This time? No   Have you Recently Had Thoughts About [place]? No  Explanation: No data recorded  Have You Used Any Alcohol or Drugs in the Past 24 Hours? Yes  How Long Ago Did You Use Drugs or Alcohol? No data recorded What Did You Use and How Much? Alcohol, cocaine and marijuana   Do You Currently Have a Therapist/Psychiatrist? No  Name of Therapist/Psychiatrist: No data recorded  Have You Been Recently Discharged From Any Office Practice or Programs? No  Explanation of  Discharge From Practice/Program: No data recorded    CCA Screening Triage Referral Assessment Type of Contact: Face-to-Face  Is this Initial or Reassessment? No data recorded Date Telepsych consult ordered in [facility]:  No data recorded Time Telepsych consult ordered in [facility]:  No data recorded  Patient  Reported Information Reviewed? No data recorded Patient Left Without Being Seen? No data recorded Reason for Not Completing Assessment: No data recorded  Collateral Involvement: None provided   Does Patient Have a [place]? No data recorded Name and Contact of Legal Guardian: No data recorded If Minor and Not Living with Parent(s), Who has Custody? n/a  Is CPS involved or ever been involved? Never  Is APS involved or ever been involved? Never   Patient Determined To Be At Risk for Harm To Self or Others Based on Review of Patient Reported Information or Presenting Complaint? Yes, for Self-Harm  Method: No data recorded Availability of Means: No data recorded Intent: No data recorded Notification Required: No data recorded Additional Information for Danger to Others Potential: No data recorded Additional Comments for Danger to Others Potential: No data recorded Are There Guns or Other Weapons in Your Home? No data recorded Types of Guns/Weapons: No data recorded Are These Weapons Safely Secured?                            No data recorded Who Could Verify You Are Able To Have These Secured: No data recorded Do You Have any Outstanding Charges, Pending Court Dates, Parole/Probation? No data recorded Contacted To Inform of Risk of Harm To Self or Others: No data recorded  Location of Assessment: [facility] ED   Does Patient Present under Involuntary Commitment? No  IVC Papers Initial File Date: No data recorded  [place] of Residence: [place]   Patient Currently Receiving the Following Services: Not Receiving Services   Determination of Need: Urgent (48 hours)   Options For Referral: ED Visit; Inpatient Hospitalization; Medication Management; Chemical Dependency Intensive Outpatient Therapy (CDIOP); Outpatient Therapy     CCA Biopsychosocial Intake/Chief Complaint:  No data recorded Current Symptoms/Problems: No data recorded  Patient Reported  Schizophrenia/Schizoaffective Diagnosis in Past: No   Strengths: Patient able to communicate and verbalize needs.  Preferences: No data recorded Abilities: No data recorded  Type of Services Patient Feels are Needed: No data recorded  Initial Clinical Notes/Concerns: No data recorded  Mental Health Symptoms Depression:   Change in energy/activity; Hopelessness; Sleep (too much or little); Tearfulness; Worthlessness; Difficulty Concentrating   Duration of Depressive symptoms:  Greater than two weeks   Mania:   None   Anxiety:    Difficulty concentrating; Sleep; Worrying; Tension   Psychosis:   None   Duration of Psychotic symptoms: No data recorded  Trauma:   None   Obsessions:   None   Compulsions:   None   Inattention:   None   Hyperactivity/Impulsivity:  No data recorded  Oppositional/Defiant Behaviors:   None   Emotional Irregularity:   Chronic feelings of emptiness; Recurrent suicidal behaviors/gestures/threats; Potentially harmful impulsivity   Other Mood/Personality Symptoms:  No data recorded   Mental Status Exam Appearance and self-care  Stature:   Small   Weight:   Average weight   Clothing:   Disheveled   Grooming:   Neglected   Cosmetic use:   None   Posture/gait:   Slumped   Motor activity:   Not Remarkable   Sensorium  Attention:  Normal   Concentration:   Anxiety interferes   Orientation:   X5   Recall/memory:   Normal   Affect and Mood  Affect:   Anxious; Depressed; Flat; Tearful   Mood:   Anxious; Depressed; Hopeless; Worthless   Relating  Eye contact:   Avoided   Facial expression:   Anxious; Depressed; Sad   Attitude toward examiner:   Cooperative   Thought and Language  Speech flow:  Clear and Coherent; Pressured   Thought content:   Appropriate to Mood and Circumstances   Preoccupation:   None   Hallucinations:   None   Organization:  No data recorded  [employer]  of Knowledge:   Average   Intelligence:   Average   Abstraction:   Normal   Judgement:   Impaired   Reality Testing:   Adequate   Insight:   Fair   Decision Making:   Impulsive   Social Functioning  Social Maturity:   Isolates; Impulsive   Social Judgement:   "Street Smart"   Stress  Stressors:   Grief/losses; Relationship; Transitions; Housing; Museum/gallery curator; Work   Coping Ability:   Advice worker Deficits:   Training and development officer   Supports:   Support needed     Religion:    Leisure/Recreation:    Exercise/Diet: Exercise/Diet Do You Have Any Trouble Sleeping?: Yes Explanation of Sleeping Difficulties: Patient reports he has not been able to sleep.   CCA Employment/Education Employment/Work Situation: Employment / Work Technical sales engineer: Unemployed  Education: Education Is Patient Currently Attending School?: No   CCA Family/Childhood History Family and Relationship History: Family history Does patient have children?: Yes How is patient's relationship with their children?: Patient reports he has a strained relationship with his kids.  Childhood History:     Child/Adolescent Assessment:     CCA Substance Use Alcohol/Drug Use: Alcohol / Drug Use Pain Medications: See PTA Prescriptions: See PTA Over the Counter: See PTA History of alcohol / drug use?: Yes Longest period of sobriety (when/how long): Unknown Negative Consequences of Use: Financial, Personal relationships Substance #1 Name of Substance 1: Alcohol 1 - Frequency: daily 1 - Last Use / Amount: 06/17/21/ 12 pack of beer 1- Route of Use: oral Substance #2 Name of Substance 2: Cocaine                     ASAM's:  Six Dimensions of Multidimensional Assessment  Dimension 1:  Acute Intoxication and/or Withdrawal Potential:      Dimension 2:  Biomedical Conditions and Complications:      Dimension 3:  Emotional, Behavioral, or  Cognitive Conditions and Complications:     Dimension 4:  Readiness to Change:     Dimension 5:  Relapse, Continued use, or Continued Problem Potential:     Dimension 6:  Recovery/Living Environment:     ASAM Severity Score:    ASAM Recommended Level of Treatment:     Substance use Disorder (SUD) Substance Use Disorder (SUD)  Checklist Symptoms of Substance Use: Continued use despite having a persistent/recurrent physical/psychological problem caused/exacerbated by use, Evidence of tolerance, Continued use despite persistent or recurrent social, interpersonal problems, caused or exacerbated by use, Substance(s) often taken in larger amounts or over longer times than was intended, Persistent desire or unsuccessful efforts to cut down or control use  Recommendations for Services/Supports/Treatments: Recommendations for Services/Supports/Treatments Recommendations For Services/Supports/Treatments: Individual Therapy, Medication Management, IOP (Intensive Outpatient Program)  DSM5 Diagnoses: Patient Active Problem List  Diagnosis Date Noted   MDD (major depressive disorder) 06/18/2021   Suicidal ideation    Tobacco abuse    Polysubstance abuse ([place])    Leukocytosis    Acute respiratory failure with hypoxia ([place]) 10/06/2020    Patient Centered Plan: Patient is on the following Treatment Plan(s):  Anxiety, Depression, and Substance Abuse   Referrals to Alternative Service(s): Referred to Alternative Service(s):   Place:   Date:   Time:    Referred to Alternative Service(s):   Place:   Date:   Time:    Referred to Alternative Service(s):   Place:   Date:   Time:    Referred to Alternative Service(s):   Place:   Date:   Time:      @BHCOLLABOFCARE @  [place], Counselor, LCAS-A

## 2021-06-18 NOTE — ED Notes (Signed)
Patient provided snack at appropriate snack time.  Pt consumed 100% of snack provided, tolerated well w/o complaints   Trash disposted of appropriately by patient.  

## 2021-06-18 NOTE — BH Assessment (Signed)
Attempted to assess patient with Psyc NP but patient was unable to be aroused at the time

## 2021-06-18 NOTE — ED Notes (Signed)
Pt has no orders at this time to be admitted, awaiting Psych NP arrival to place orders

## 2021-06-18 NOTE — ED Notes (Signed)
Psych team and this nurse both work to wake up pt. Pt eventually opens his eyes and this nurse helps to sit him up, pt sleepy. While speaking with psych team he stops answering questions while sitting up and falls asleep, eventually lays back down. Pt is intoxicated, reports smoking marijuana to security, and has received ativan tonight. Resp even and unlabored

## 2021-06-18 NOTE — ED Notes (Signed)
Pt reports poor family relationship, wants to improve it, intoxicated now, drinks daily, very tearful and animated during assessment. Begging for help with anxiety and depression

## 2021-06-18 NOTE — ED Notes (Signed)
Report received from Katie, RN including SBAR. Patient alert and oriented, warm and dry, in no acute distress. Patient denies SI, HI, AVH and pain. Patient made aware of Q15 minute rounds and security cameras for their safety. Patient instructed to come to this nurse with needs or concerns.  

## 2021-06-18 NOTE — ED Notes (Signed)
Pt provided with breakfast tray.

## 2021-06-18 NOTE — ED Notes (Signed)
VOL/pending psych consult 

## 2021-06-18 NOTE — ED Notes (Signed)
Pt talking to security officer, tearful, rocking back in forth in chair, states he needs help getting back on his medications for depression and talking about his children.

## 2021-06-18 NOTE — ED Provider Notes (Signed)
[facility] Provider Note    Event Date/Time   First MD Initiated Contact with Patient 06/18/21 0145     (approximate)   History   Suicidal   HPI  Francisco Barrett is a 41 y.o. male with past medical history of alcohol tobacco abuse who presents for evaluation of worsening depression and suicidal thoughts.  Patient was apparently found standing middle-of-the-road and said he was trying to get hit by car to kill himself.  Patient states he has had worsening depression which he attributes to his drinking and cocaine use.  He denies any other specific stressors.  He denies any hallucinations or HI.  He states he drinks as much as he can every day.  He denies any acute physical complaints including headache, earache, sore throat, nausea, vomiting, diarrhea, rash or any other clear associated physical sick symptoms.  States he has been to rehab before for his drinking but feels that he has some underlying untreated and undiagnosed depression that he also feels he needs to be medicated for.  He denies any other acute concerns at this time.    Past Medical History:  Diagnosis Date   H/O ETOH abuse    Tobacco abuse      Physical Exam  Triage Vital Signs: ED Triage Vitals  Enc Vitals Group     BP 06/18/21 0110 (!) 137/101     Pulse Rate 06/18/21 0110 79     Resp 06/18/21 0110 18     Temp 06/18/21 0110 98 F (36.7 C)     Temp Source 06/18/21 0110 Oral     SpO2 06/18/21 0110 97 %     Weight 06/18/21 0111 127 lb 13.9 oz (58 kg)     Height 06/18/21 0111 5\' 3"  (1.6 m)     Head Circumference --      Peak Flow --      Pain Score --      Pain Loc --      Pain Edu? --      Excl. in GC? --     Most recent vital signs: Vitals:   06/18/21 0110  BP: (!) 137/101  Pulse: 79  Resp: 18  Temp: 98 F (36.7 C)  SpO2: 97%    General: Awake, appears fairly upset and is crying.  Does appear sounds intoxicated. CV:  Good peripheral perfusion. Resp:  Normal  effort.  Abd:  No distention.  Other:  Seems depressed and suicidal.  Does not seem agitated or homicidal or actively hallucinating.   ED Results / Procedures / Treatments  Labs (all labs ordered are listed, but only abnormal results are displayed) Labs Reviewed  COMPREHENSIVE METABOLIC PANEL - Abnormal; Notable for the following components:      Result Value   Glucose, Bld 104 (*)    All other components within normal limits  ETHANOL - Abnormal; Notable for the following components:   Alcohol, Ethyl (B) 175 (*)    All other components within normal limits  SALICYLATE LEVEL - Abnormal; Notable for the following components:   Salicylate Lvl <7.0 (*)    All other components within normal limits  ACETAMINOPHEN LEVEL - Abnormal; Notable for the following components:   Acetaminophen (Tylenol), Serum <10 (*)    All other components within normal limits  URINE DRUG SCREEN, QUALITATIVE ([facility] ONLY) - Abnormal; Notable for the following components:   Amphetamines, Ur Screen POSITIVE (*)    Cannabinoid 50 Ng, Ur [place] POSITIVE (*)  All other components within normal limits  RESP PANEL BY RT-PCR (FLU A&B, COVID) ARPGX2  CBC     EKG    RADIOLOGY    PROCEDURES:  Critical Care performed: No  Procedures    MEDICATIONS ORDERED IN ED: Medications  LORazepam (ATIVAN) tablet 1-4 mg (has no administration in time range)    Or  LORazepam (ATIVAN) injection 1-4 mg (has no administration in time range)  thiamine tablet 100 mg (has no administration in time range)    Or  thiamine (B-1) injection 100 mg (has no administration in time range)  folic acid (FOLVITE) tablet 1 mg (has no administration in time range)  multivitamin with minerals tablet 1 tablet (has no administration in time range)  LORazepam (ATIVAN) tablet 1 mg (1 mg Oral Given 06/18/21 0157)     IMPRESSION / MDM / ASSESSMENT AND PLAN / ED COURSE  I reviewed the triage vital signs and the nursing notes.                               Differential diagnosis includes, but is not limited to substance induced depression and suicidality versus undiagnosed worsening underlying psychiatric illness.  He denies any other acute physical sick symptoms or recent injuries or falls or attempt to harm himself prior to arrival today.  He does seem intoxicated on exam.  CMP without significant electrolyte or metabolic derangements.  CBC is unremarkable.  Acetaminophen and salicylate levels undetectable.  Serum alcohol is 175.  UDS is positive for amphetamines and cannabis.  States he is desperate to get some help and so we will maintain him voluntarily at this time.  Will consult psychiatry and TTS.  The patient has been placed in psychiatric observation due to the need to provide a safe environment for the patient while obtaining psychiatric consultation and evaluation, as well as ongoing medical and medication management to treat the patient's condition.  The patient has not been placed under full IVC at this time.         FINAL CLINICAL IMPRESSION(S) / ED DIAGNOSES   Final diagnoses:  Suicidal ideation  Polysubstance abuse (HCC)     Rx / DC Orders   ED Discharge Orders     None        Note:  This document was prepared using Dragon voice recognition software and may include unintentional dictation errors.   Gilles Chiquito, MD 06/18/21 [phone]

## 2021-06-18 NOTE — ED Notes (Signed)
Hospital meal provided, pt tolerated w/o complaints.  Waste discarded appropriately.  

## 2021-06-18 NOTE — ED Notes (Signed)
Pt now agreeable to take medications.

## 2021-06-18 NOTE — Tx Team (Signed)
Initial Treatment Plan 06/18/2021 10:31 PM Francisco Barrett [id number]    PATIENT STRESSORS: Marital or family conflict   Substance abuse     PATIENT STRENGTHS: Ability for insight  Motivation for treatment/growth    PATIENT IDENTIFIED PROBLEMS: Depression  Anxiety                   DISCHARGE CRITERIA:  Motivation to continue treatment in a less acute level of care Verbal commitment to aftercare and medication compliance  PRELIMINARY DISCHARGE PLAN: Outpatient therapy Placement in alternative living arrangements  PATIENT/FAMILY INVOLVEMENT: This treatment plan has been presented to and reviewed with the patient, Francisco Barrett. The patient has been given the opportunity to ask questions and make suggestions.  Mallie Darting, RN 06/18/2021, 10:31 PM

## 2021-06-18 NOTE — ED Notes (Signed)
Pt to be admitted to [facility] BMU tonight 2.22.23 after 7:30 pm

## 2021-06-18 NOTE — Plan of Care (Signed)
Patient new to the unit tonight, hasn't had time to progress  Problem: Education: Goal: Knowledge of [facility] Education information/materials will improve Outcome: Not Progressing Goal: Emotional status will improve Outcome: Not Progressing Goal: Mental status will improve Outcome: Not Progressing Goal: Verbalization of understanding the information provided will improve Outcome: Not Progressing   Problem: Safety: Goal: Periods of time without injury will increase Outcome: Not Progressing   Problem: Education: Goal: Utilization of techniques to improve thought processes will improve Outcome: Not Progressing Goal: Knowledge of the prescribed therapeutic regimen will improve Outcome: Not Progressing   Problem: Safety: Goal: Ability to disclose and discuss suicidal ideas will improve Outcome: Not Progressing Goal: Ability to identify and utilize support systems that promote safety will improve Outcome: Not Progressing   

## 2021-06-18 NOTE — Progress Notes (Signed)
Patient admitted from [facility], report received from [place], [place]. Pt calm and pleasant during assessment. Pt denies SI/HI/AVH. Pt endorses anxiety and depression stating that his depression come from not being in his kids lives. Pt wants to work on rectifying his relationship with his family. Pt endorses alcohol abuse and is on CIWA. Pt oriented to the unit and his room, given snack. Pt given education, support, and encouragement to be active in his treatment plan. Pt being monitored Q 15 minutes for safety per unit protocol. Pt remains safe on the unit.

## 2021-06-18 NOTE — ED Notes (Signed)
Pt belongings;  Black shoes, jeans, black boxers, white socks, black book bag, green hooded sweatshirt, gray and black 3/4 length sleeve shirt, red and gray striped belt, gray hat.  Pt dressed out by this RN and Georgiann Hahn EDT and [employer]

## 2021-06-18 NOTE — ED Notes (Signed)
Pt acclimated to unit, staff explained unit expectations / regulation to Mr Francisco Barrett.  Pt verbalized understanding.  Mr Francisco Barrett has been accepted to the inpatient unit and is awaiting an open bed.  Continue to monitor as ordered

## 2021-06-19 DIAGNOSIS — F1010 Alcohol abuse, uncomplicated: Secondary | ICD-10-CM

## 2021-06-19 DIAGNOSIS — F332 Major depressive disorder, recurrent severe without psychotic features: Principal | ICD-10-CM

## 2021-06-19 DIAGNOSIS — F1410 Cocaine abuse, uncomplicated: Secondary | ICD-10-CM

## 2021-06-19 DIAGNOSIS — I10 Essential (primary) hypertension: Secondary | ICD-10-CM

## 2021-06-19 MED ORDER — NICOTINE 21 MG/24HR TD PT24
21.0000 mg | MEDICATED_PATCH | Freq: Every day | TRANSDERMAL | Status: DC
  Administered 2021-06-19 – 2021-06-24 (×6): 21 mg via TRANSDERMAL
  Filled 2021-06-19 (×5): qty 1

## 2021-06-19 MED ORDER — HYDROXYZINE HCL 50 MG PO TABS
50.0000 mg | ORAL_TABLET | Freq: Four times a day (QID) | ORAL | Status: DC | PRN
  Administered 2021-06-19 – 2021-06-23 (×5): 50 mg via ORAL
  Filled 2021-06-19 (×5): qty 1

## 2021-06-19 MED ORDER — AMLODIPINE BESYLATE 5 MG PO TABS
5.0000 mg | ORAL_TABLET | Freq: Every day | ORAL | Status: DC
Start: 2021-06-19 — End: 2021-06-23
  Administered 2021-06-19 – 2021-06-23 (×5): 5 mg via ORAL
  Filled 2021-06-19 (×5): qty 1

## 2021-06-19 MED ORDER — MIRTAZAPINE 15 MG PO TABS
15.0000 mg | ORAL_TABLET | Freq: Every day | ORAL | Status: DC
  Administered 2021-06-19: 15 mg via ORAL
  Filled 2021-06-19: qty 1

## 2021-06-19 NOTE — [facility] Suicide Risk Assessment (Signed)
[facility] Admission Suicide Risk Assessment   Nursing information obtained from:  Patient Demographic factors:  Male Current Mental Status:  Suicidal ideation indicated by patient Loss Factors:  Loss of significant relationship, Financial problems / change in socioeconomic status Historical Factors:  Impulsivity Risk Reduction Factors:  Positive coping skills or problem solving skills  Total Time spent with patient: 1 hour Principal Problem: MDD (major depressive disorder), recurrent episode, severe (HCC) Diagnosis:  Principal Problem:   MDD (major depressive disorder), recurrent episode, severe (HCC) Active Problems:   Alcohol abuse   Cocaine abuse (HCC)   Essential hypertension  Subjective Data: Patient seen and chart reviewed.  41 year old man with a history of substance abuse and depression came to the hospital after police found him standing in the middle of the street looking confused and voicing suicidal ideation.  On interview today the patient is sober but continues to say he has suicidal thoughts although without any plan or intent of acting on it currently.  Denies psychotic symptoms.  Not showing symptoms of major alcohol withdrawal.  Cooperative with appropriate treatment planning  Continued Clinical Symptoms:  Alcohol Use Disorder Identification Test Final Score (AUDIT): 7 The "Alcohol Use Disorders Identification Test", Guidelines for Use in Primary Care, Second Edition.  World Science writer [facility]). Score between 0-7:  no or low risk or alcohol related problems. Score between 8-15:  moderate risk of alcohol related problems. Score between 16-19:  high risk of alcohol related problems. Score 20 or above:  warrants further diagnostic evaluation for alcohol dependence and treatment.   CLINICAL FACTORS:   Depression:   Comorbid alcohol abuse/dependence Alcohol/Substance Abuse/Dependencies   Musculoskeletal: Strength & Muscle Tone: within normal limits Gait & Station:  normal Patient leans: N/A  Psychiatric Specialty Exam:  Presentation  General Appearance: Appropriate for Environment  Eye Contact:Poor  Speech:Clear and Coherent  Speech Volume:Normal  Handedness:No data recorded  Mood and Affect  Mood:Depressed; Worthless  Affect:Blunt; Congruent   Thought Process  Thought Processes:Coherent  Descriptions of Associations:Intact  Orientation:Full (Time, Place and Person)  Thought Content:WDL  History of Schizophrenia/Schizoaffective disorder:No  Duration of Psychotic Symptoms:No data recorded Hallucinations:Hallucinations: None  Ideas of Reference:None  Suicidal Thoughts:Suicidal Thoughts: No  Homicidal Thoughts:Homicidal Thoughts: No   Sensorium  Memory:Immediate Good  Judgment:Fair  Insight:Fair   Executive Functions  Concentration:Fair  Attention Span:Good  Recall:Good  Fund of Knowledge:Fair  Language:Good   Psychomotor Activity  Psychomotor Activity:Psychomotor Activity: Normal   Assets  Assets:Desire for Improvement   Sleep  Sleep:Sleep: Poor    Physical Exam: Physical Exam Vitals and nursing note reviewed.  Constitutional:      Appearance: Normal appearance.  HENT:     Head: Normocephalic and atraumatic.     Mouth/Throat:     Pharynx: Oropharynx is clear.  Eyes:     Pupils: Pupils are equal, round, and reactive to light.  Cardiovascular:     Rate and Rhythm: Normal rate and regular rhythm.  Pulmonary:     Effort: Pulmonary effort is normal.     Breath sounds: Normal breath sounds.  Abdominal:     General: Abdomen is flat.     Palpations: Abdomen is soft.  Musculoskeletal:        General: Normal range of motion.  Skin:    General: Skin is warm and dry.  Neurological:     General: No focal deficit present.     Mental Status: He is alert. Mental status is at baseline.  Psychiatric:  Attention and Perception: Attention normal.        Mood and Affect: Mood is depressed.  Affect is blunt.        Speech: Speech normal.        Behavior: Behavior is cooperative.        Thought Content: Thought content includes suicidal ideation. Thought content does not include suicidal plan.        Cognition and Memory: Cognition normal.        Judgment: Judgment is impulsive.   Review of Systems  Constitutional: Negative.   HENT: Negative.    Eyes: Negative.   Respiratory: Negative.    Cardiovascular: Negative.   Gastrointestinal: Negative.   Musculoskeletal: Negative.   Skin: Negative.   Neurological: Negative.   Psychiatric/Behavioral:  Positive for depression, substance abuse and suicidal ideas. The patient is nervous/anxious and has insomnia.   Blood pressure (!) 129/99, pulse 99, temperature 98.5 F (36.9 C), temperature source Oral, resp. rate 18, height 5\' 3"  (1.6 m), weight 58 kg, SpO2 97 %. Body mass index is 22.65 kg/m.   COGNITIVE FEATURES THAT CONTRIBUTE TO RISK:  Thought constriction (tunnel vision)    SUICIDE RISK:   Mild:  Suicidal ideation of limited frequency, intensity, duration, and specificity.  There are no identifiable plans, no associated intent, mild dysphoria and related symptoms, good self-control (both objective and subjective assessment), few other risk factors, and identifiable protective factors, including available and accessible social support.  PLAN OF CARE: Continue 15-minute checks.  Initiate medicine for depression.  Monitor for signs of alcohol withdrawal.  Engage in individual and group therapy and appropriate meetings with the treatment team.  Ongoing assessment of dangerousness prior to discharge.  I certify that inpatient services furnished can reasonably be expected to improve the patient's condition.   , MD 06/19/2021, 3:33 PM

## 2021-06-19 NOTE — H&P (Signed)
Psychiatric Admission Assessment Adult  Patient Identification: Johnnye LanaLennis Shuntane Norsworthy MRN:  [id number] Date of Evaluation:  06/19/2021 Chief Complaint:  MDD (major depressive disorder), recurrent episode, severe (HCC) [F33.2] Principal Diagnosis: MDD (major depressive disorder), recurrent episode, severe (HCC) Diagnosis:  Principal Problem:   MDD (major depressive disorder), recurrent episode, severe (HCC) Active Problems:   Alcohol abuse   Cocaine abuse (HCC)   Essential hypertension  History of Present Illness: Patient seen and chart reviewed.  41 year old male came to the hospital after being picked up by police.  Patient was standing out in the middle of the road and looking confused and voicing suicidal ideation.  Appeared to be intoxicated.  On interview today the patient says he is feeling depressed.  His mood is felt depressed bad and negative for a long time.  He is evasive about giving much in the way of specific detail.  He says he sleeps badly at night and he feels bad all the time.  He was having suicidal thoughts yesterday of walking out into traffic.  He denies any hallucinations or psychotic symptoms.  Patient states that he drinks about 2 of the 40 ounce beers a day and sometimes liquor on top of that and uses crack cocaine regularly and sometimes other drugs.  His drug screen is positive for amphetamines.  Patient states he wants to have treatment for his depression as well as substance abuse treatment Associated Signs/Symptoms: Depression Symptoms:  depressed mood, anhedonia, insomnia, feelings of worthlessness/guilt, difficulty concentrating, hopelessness, suicidal thoughts without plan, Duration of Depression Symptoms: Greater than two weeks  (Hypo) Manic Symptoms:  Irritable Mood, Anxiety Symptoms:  Excessive Worry, Psychotic Symptoms:   None reported PTSD Symptoms: Negative Total Time spent with patient: 1 hour  Past Psychiatric History: Patient has history  of depression by his report but has never been on any medicine or received specific treatment for depression.  No past suicide attempts.  Only past mental health treatment has been for substance abuse.  He says he has been to [facility] several times in the past and completed the programs and been able to stay sober for extended periods of time.  Is the patient at risk to self? Yes.    Has the patient been a risk to self in the past 6 months? Yes.    Has the patient been a risk to self within the distant past? No.  Is the patient a risk to others? No.  Has the patient been a risk to others in the past 6 months? No.  Has the patient been a risk to others within the distant past? No.   Prior Inpatient Therapy:   Prior Outpatient Therapy:    Alcohol Screening: 1. How often do you have a drink containing alcohol?: 2 to 4 times a month 2. How many drinks containing alcohol do you have on a typical day when you are drinking?: 5 or 6 3. How often do you have six or more drinks on one occasion?: Weekly AUDIT-C Score: 7 4. How often during the last year have you found that you were not able to stop drinking once you had started?: Never 5. How often during the last year have you failed to do what was normally expected from you because of drinking?: Never 6. How often during the last year have you needed a first drink in the morning to get yourself going after a heavy drinking session?: Never 7. How often during the last year have you had a feeling  of guilt of remorse after drinking?: Never 8. How often during the last year have you been unable to remember what happened the night before because you had been drinking?: Never 9. Have you or someone else been injured as a result of your drinking?: No 10. Has a relative or friend or a doctor or another health worker been concerned about your drinking or suggested you cut down?: No Alcohol Use Disorder Identification Test Final Score (AUDIT): 7 Alcohol  Brief Interventions/Follow-up: Alcohol education/Brief advice Substance Abuse History in the last 12 months:  Yes.   Consequences of Substance Abuse: Major contributor to worsening mental health problems Previous Psychotropic Medications: No  Psychological Evaluations: No  Past Medical History:  Past Medical History:  Diagnosis Date   H/O ETOH abuse    Tobacco abuse    History reviewed. No pertinent surgical history. Family History: History reviewed. No pertinent family history. Family Psychiatric  History: None reported Tobacco Screening:   Social History:  Social History   Substance and Sexual Activity  Alcohol Use Yes   Comment: 2 40's     Social History   Substance and Sexual Activity  Drug Use Yes   Types: Marijuana, Cocaine    Additional Social History:                           Allergies:  No Known Allergies Lab Results:  Results for orders placed or performed during the hospital encounter of 06/18/21 (from the past 48 hour(s))  Comprehensive metabolic panel     Status: Abnormal   Collection Time: 06/18/21  1:40 AM  Result Value Ref Range   Sodium 141 135 - 145 mmol/L   Potassium 4.1 3.5 - 5.1 mmol/L   Chloride 105 98 - 111 mmol/L   CO2 23 22 - 32 mmol/L   Glucose, Bld 104 (H) 70 - 99 mg/dL    Comment: Glucose reference range applies only to samples taken after fasting for at least 8 hours.   BUN 15 6 - 20 mg/dL   Creatinine, Ser 2.59 0.61 - 1.24 mg/dL   Calcium 9.2 8.9 - 56.3 mg/dL   Total Protein 7.8 6.5 - 8.1 g/dL   Albumin 4.4 3.5 - 5.0 g/dL   AST 24 15 - 41 U/L   ALT 25 0 - 44 U/L   Alkaline Phosphatase 61 38 - 126 U/L   Total Bilirubin 0.4 0.3 - 1.2 mg/dL   GFR, Estimated >87 >56 mL/min    Comment: (NOTE) Calculated using the CKD-EPI Creatinine Equation (2021)    Anion gap 13 5 - 15    Comment: Performed at [facility], [address] [place], [place] [address]  Ethanol     Status: Abnormal   Collection Time: 06/18/21   1:40 AM  Result Value Ref Range   Alcohol, Ethyl (B) 175 (H) <10 mg/dL    Comment: (NOTE) Lowest detectable limit for serum alcohol is 10 mg/dL.  For medical purposes only. Performed at [facility], [address] [place], [place] [address]   Salicylate level     Status: Abnormal   Collection Time: 06/18/21  1:40 AM  Result Value Ref Range   Salicylate Lvl <7.0 (L) 7.0 - 30.0 mg/dL    Comment: Performed at [facility], [address] [place], [place] [address]  Acetaminophen level     Status: Abnormal   Collection Time: 06/18/21  1:40 AM  Result Value Ref Range  Acetaminophen (Tylenol), Serum <10 (L) 10 - 30 ug/mL    Comment: (NOTE) Therapeutic concentrations vary significantly. A range of 10-30 ug/mL  may be an effective concentration for many patients. However, some  are best treated at concentrations outside of this range. Acetaminophen concentrations >150 ug/mL at 4 hours after ingestion  and >50 ug/mL at 12 hours after ingestion are often associated with  toxic reactions.  Performed at [facility], [address] [place], [place] [address]   cbc     Status: None   Collection Time: 06/18/21  1:40 AM  Result Value Ref Range   WBC 7.3 4.0 - 10.5 K/uL   RBC 4.38 4.22 - 5.81 MIL/uL   Hemoglobin 14.2 13.0 - 17.0 g/dL   HCT 96.242.5 95.239.0 - 84.152.0 %   MCV 97.0 80.0 - 100.0 fL   MCH 32.4 26.0 - 34.0 pg   MCHC 33.4 30.0 - 36.0 g/dL   RDW 32.412.0 40.111.5 - 02.715.5 %   Platelets 326 150 - 400 K/uL   nRBC 0.0 0.0 - 0.2 %    Comment: Performed at [facility], [address] [place], [place] [address]  Urine Drug Screen, Qualitative     Status: Abnormal   Collection Time: 06/18/21  1:40 AM  Result Value Ref Range   Tricyclic, Ur Screen NONE DETECTED NONE DETECTED   Amphetamines, Ur Screen POSITIVE (A) NONE DETECTED   MDMA (Ecstasy)Ur Screen NONE DETECTED NONE DETECTED   Cocaine Metabolite,Ur [place] NONE DETECTED NONE DETECTED   Opiate, Ur Screen  NONE DETECTED NONE DETECTED   Phencyclidine (PCP) Ur S NONE DETECTED NONE DETECTED   Cannabinoid 50 Ng, Ur [place] POSITIVE (A) NONE DETECTED   Barbiturates, Ur Screen NONE DETECTED NONE DETECTED   Benzodiazepine, Ur Scrn NONE DETECTED NONE DETECTED   Methadone Scn, Ur NONE DETECTED NONE DETECTED    Comment: (NOTE) Tricyclics + metabolites, urine    Cutoff 1000 ng/mL Amphetamines + metabolites, urine  Cutoff 1000 ng/mL MDMA (Ecstasy), urine              Cutoff 500 ng/mL Cocaine Metabolite, urine          Cutoff 300 ng/mL Opiate + metabolites, urine        Cutoff 300 ng/mL Phencyclidine (PCP), urine         Cutoff 25 ng/mL Cannabinoid, urine                 Cutoff 50 ng/mL Barbiturates + metabolites, urine  Cutoff 200 ng/mL Benzodiazepine, urine              Cutoff 200 ng/mL Methadone, urine                   Cutoff 300 ng/mL  The urine drug screen provides only a preliminary, unconfirmed analytical test result and should not be used for non-medical purposes. Clinical consideration and professional judgment should be applied to any positive drug screen result due to possible interfering substances. A more specific alternate chemical method must be used in order to obtain a confirmed analytical result. Gas chromatography / mass spectrometry (GC/MS) is the preferred confirm atory method. Performed at [facility], [address] [place], [place] [address]   Resp Panel by RT-PCR (Flu A&B, Covid) Nasopharyngeal Swab     Status: None   Collection Time: 06/18/21  1:40 AM   Specimen: Nasopharyngeal Swab; Nasopharyngeal(NP) swabs in vial transport medium  Result Value Ref Range   SARS Coronavirus 2  by RT PCR NEGATIVE NEGATIVE    Comment: (NOTE) SARS-CoV-2 target nucleic acids are NOT DETECTED.  The SARS-CoV-2 RNA is generally detectable in upper respiratory specimens during the acute phase of infection. The lowest concentration of SARS-CoV-2 viral copies this assay can detect  is 138 copies/mL. A negative result does not preclude SARS-Cov-2 infection and should not be used as the sole basis for treatment or other patient management decisions. A negative result may occur with  improper specimen collection/handling, submission of specimen other than nasopharyngeal swab, presence of viral mutation(s) within the areas targeted by this assay, and inadequate number of viral copies(<138 copies/mL). A negative result must be combined with clinical observations, patient history, and epidemiological information. The expected result is Negative.  Fact Sheet for Patients:  BloggerCourse.com  Fact Sheet for Healthcare Providers:  SeriousBroker.it  This test is no t yet approved or cleared by the [place] FDA and  has been authorized for detection and/or diagnosis of SARS-CoV-2 by FDA under an Emergency Use Authorization (EUA). This EUA will remain  in effect (meaning this test can be used) for the duration of the COVID-19 declaration under Section 564(b)(1) of the Act, 21 U.S.C.section 360bbb-3(b)(1), unless the authorization is terminated  or revoked sooner.       Influenza A by PCR NEGATIVE NEGATIVE   Influenza B by PCR NEGATIVE NEGATIVE    Comment: (NOTE) The Xpert Xpress SARS-CoV-2/FLU/RSV plus assay is intended as an aid in the diagnosis of influenza from Nasopharyngeal swab specimens and should not be used as a sole basis for treatment. Nasal washings and aspirates are unacceptable for Xpert Xpress SARS-CoV-2/FLU/RSV testing.  Fact Sheet for Patients: BloggerCourse.com  Fact Sheet for Healthcare Providers: SeriousBroker.it  This test is not yet approved or cleared by the [place] FDA and has been authorized for detection and/or diagnosis of SARS-CoV-2 by FDA under an Emergency Use Authorization (EUA). This EUA will remain in effect (meaning this  test can be used) for the duration of the COVID-19 declaration under Section 564(b)(1) of the Act, 21 U.S.C. section 360bbb-3(b)(1), unless the authorization is terminated or revoked.  Performed at [facility], [address] [place], [place] [address]     Blood Alcohol level:  Lab Results  Component Value Date   ETH 175 (H) 06/18/2021   ETH 403 (HH) 12/23/2020    Metabolic Disorder Labs:  No results found for: HGBA1C, MPG No results found for: PROLACTIN No results found for: CHOL, TRIG, HDL, CHOLHDL, VLDL, LDLCALC  Current Medications: Current Facility-Administered Medications  Medication Dose Route Frequency Provider Last Rate Last Admin   acetaminophen (TYLENOL) tablet 650 mg  650 mg Oral Q6H PRN Gillermo Murdoch, NP       alum & mag hydroxide-simeth (MAALOX/MYLANTA) 200-200-20 MG/5ML suspension 30 mL  30 mL Oral Q4H PRN Gillermo Murdoch, NP       amLODipine (NORVASC) tablet 5 mg  5 mg Oral Daily Renly Guedes T, MD   5 mg at 06/19/21 1428   folic acid (FOLVITE) tablet 1 mg  1 mg Oral Daily Gillermo Murdoch, NP   1 mg at 06/19/21 0848   hydrOXYzine (ATARAX) tablet 50 mg  50 mg Oral Q6H PRN Lawson Mahone T, MD   50 mg at 06/19/21 0904   LORazepam (ATIVAN) tablet 1 mg  1 mg Oral Q6H PRN Gillermo Murdoch, NP       magnesium hydroxide (MILK OF MAGNESIA) suspension 30 mL  30 mL Oral Daily PRN Gillermo Murdoch, NP  mirtazapine (REMERON) tablet 15 mg  15 mg Oral QHS Domnique Vantine, Jackquline Denmark, MD       multivitamin with minerals tablet 1 tablet  1 tablet Oral Daily Gillermo Murdoch, NP   1 tablet at 06/19/21 0848   nicotine (NICODERM CQ - dosed in mg/24 hours) patch 21 mg  21 mg Transdermal Daily Shellyann Wandrey, Jackquline Denmark, MD   21 mg at 06/19/21 1459   PTA Medications: Medications Prior to Admission  Medication Sig Dispense Refill Last Dose   acetaminophen (TYLENOL) 325 MG tablet Take 2 tablets (650 mg total) by mouth every 6 (six) hours as needed for mild pain  (or Fever >/= 101).       Musculoskeletal: Strength & Muscle Tone: within normal limits Gait & Station: normal Patient leans: N/A            Psychiatric Specialty Exam:  Presentation  General Appearance: Appropriate for Environment  Eye Contact:Poor  Speech:Clear and Coherent  Speech Volume:Normal  Handedness:No data recorded  Mood and Affect  Mood:Depressed; Worthless  Affect:Blunt; Congruent   Thought Process  Thought Processes:Coherent  Duration of Psychotic Symptoms: No data recorded Past Diagnosis of Schizophrenia or Psychoactive disorder: No  Descriptions of Associations:Intact  Orientation:Full (Time, Place and Person)  Thought Content:WDL  Hallucinations:Hallucinations: None  Ideas of Reference:None  Suicidal Thoughts:Suicidal Thoughts: No  Homicidal Thoughts:Homicidal Thoughts: No   Sensorium  Memory:Immediate Good  Judgment:Fair  Insight:Fair   Executive Functions  Concentration:Fair  Attention Span:Good  Recall:Good  Fund of Knowledge:Fair  Language:Good   Psychomotor Activity  Psychomotor Activity:Psychomotor Activity: Normal   Assets  Assets:Desire for Improvement   Sleep  Sleep:Sleep: Poor    Physical Exam: Physical Exam Vitals and nursing note reviewed.  Constitutional:      Appearance: Normal appearance.  HENT:     Head: Normocephalic and atraumatic.     Mouth/Throat:     Pharynx: Oropharynx is clear.  Eyes:     Pupils: Pupils are equal, round, and reactive to light.  Cardiovascular:     Rate and Rhythm: Normal rate and regular rhythm.  Pulmonary:     Effort: Pulmonary effort is normal.     Breath sounds: Normal breath sounds.  Abdominal:     General: Abdomen is flat.     Palpations: Abdomen is soft.  Musculoskeletal:        General: Normal range of motion.  Skin:    General: Skin is warm and dry.  Neurological:     General: No focal deficit present.     Mental Status: He is alert.  Mental status is at baseline.  Psychiatric:        Attention and Perception: Attention normal.        Mood and Affect: Mood is anxious and depressed. Affect is blunt.        Speech: Speech is delayed.        Thought Content: Thought content includes suicidal ideation. Thought content does not include suicidal plan.        Judgment: Judgment is impulsive.   Review of Systems  Constitutional: Negative.   HENT: Negative.    Eyes: Negative.   Respiratory: Negative.    Cardiovascular: Negative.   Gastrointestinal: Negative.   Musculoskeletal: Negative.   Skin: Negative.   Neurological: Negative.   Psychiatric/Behavioral:  Positive for depression, substance abuse and suicidal ideas. The patient is nervous/anxious and has insomnia.   Blood pressure (!) 129/99, pulse 99, temperature 98.5 F (36.9 C), temperature source Oral, resp. rate  18, height 5\' 3"  (1.6 m), weight 58 kg, SpO2 97 %. Body mass index is 22.65 kg/m.  Treatment Plan Summary: Medication management and Plan patient has high blood pressure and will be started on amlodipine.  Does not appear to be having shakiness or tachycardia suggesting it is probably essential high blood pressure rather than alcohol withdrawal.  No sign of DTs or major alcohol withdrawal confusion.  Patient will be started on mirtazapine initially 15 mg at night for depression also to help with sleep and appetite.  Treatment team will meet tomorrow.  Encourage regular attendance with groups and working with treatment team on appropriate outpatient follow-up.  Ongoing assessment of dangerousness prior to discharge  Observation Level/Precautions:  15 minute checks  Laboratory:  UDS  Psychotherapy:    Medications:    Consultations:    Discharge Concerns:    Estimated LOS:  Other:     Physician Treatment Plan for Primary Diagnosis: MDD (major depressive disorder), recurrent episode, severe (HCC) Long Term Goal(s): Improvement in symptoms so as ready for  discharge  Short Term Goals: Ability to disclose and discuss suicidal ideas and Ability to demonstrate self-control will improve  Physician Treatment Plan for Secondary Diagnosis: Principal Problem:   MDD (major depressive disorder), recurrent episode, severe (HCC) Active Problems:   Alcohol abuse   Cocaine abuse (HCC)   Essential hypertension  Long Term Goal(s): Improvement in symptoms so as ready for discharge  Short Term Goals: Ability to maintain clinical measurements within normal limits will improve and Compliance with prescribed medications will improve  I certify that inpatient services furnished can reasonably be expected to improve the patient's condition.    Mordecai Rasmussen, MD 2/23/20233:35 PM

## 2021-06-19 NOTE — Progress Notes (Signed)
Recreation Therapy Notes  Date: 06/19/2021  Time: 10:30 am    Location: Courtyard     Behavioral response: N/A   Intervention Topic: Social Skills    Discussion/Intervention: Patient refused to attend group.   Clinical Observations/Feedback:  Patient refused to attend group.    Jobina Maita LRT/CTRS        Adiel Mcnamara 06/19/2021 12:16 PM

## 2021-06-19 NOTE — Group Note (Signed)
[facility] LCSW Group Therapy Note   Group Date: 06/19/2021 Start Time: 1300 End Time: 1400  Type of Therapy/Topic:  Group Therapy:  Feelings about Diagnosis  Participation Level:  Did Not Attend   Mood: n/a    Description of Group:    This group will allow patients to explore their thoughts and feelings about diagnoses they have received. Patients will be guided to explore their level of understanding and acceptance of these diagnoses. Facilitator will encourage patients to process their thoughts and feelings about the reactions of others to their diagnosis, and will guide patients in identifying ways to discuss their diagnosis with significant others in their lives. This group will be process-oriented, with patients participating in exploration of their own experiences as well as giving and receiving support and challenge from other group members.   Therapeutic Goals: 1. Patient will demonstrate understanding of diagnosis as evidence by identifying two or more symptoms of the disorder:  2. Patient will be able to express two feelings regarding the diagnosis 3. Patient will demonstrate ability to communicate their needs through discussion and/or role plays  Summary of Patient Progress: Patient did not attend group despite encouraged participation.     Therapeutic Modalities:   Cognitive Behavioral Therapy Brief Therapy Feelings Identification    Corky Crafts, [place]

## 2021-06-19 NOTE — Progress Notes (Signed)
Pt calm and pleasant during assessment. Pt denies SI/HI/AVH. Pt compliant with medication administration per MD orders. Pt given education, support, and encouragement to be active in his treatment plan. Pt being monitored Q 15 minutes for safety per unit protocol. Pt remains safe on the unit.  °

## 2021-06-19 NOTE — Plan of Care (Signed)
°  D: Pt alert and oriented. Pt rates depression 8/10, hopelessness 7/10, and anxiety 7/10. Pt goal: "My kids." Pt reports energy level as normal and concentration as being good. Pt reports sleep last night as being fair. Pt did not receive medications for sleep. Pt denies experiencing any pain at this time. Pt denies experiencing any SI/HI, or AVH at this time.   Pt has been calm and cooperative. Pt requested a nicotine patch. Pt reports smoking a pack and a half a day. Pt has been slightly irritable due to poor sleep received last night and unable to sleep much today d/t multiple evaluations/consults with various members of the interdisciplinary team.  A: Scheduled medications administered to pt, per MD orders. Support and encouragement provided. Frequent verbal contact made. Routine safety checks conducted q15 minutes.   R: No adverse drug reactions noted. Pt verbally contracts for safety at this time. Pt complaint with medications. Pt interacts minimally with others on the unit. Pt remains safe at this time. Will continue to monitor.  Problem: Education: Goal: Knowledge of  General Education information/materials will improve 06/19/2021 1024 by Sharin Mons, RN Outcome: Progressing 06/19/2021 1023 by Sharin Mons, RN Outcome: Progressing

## 2021-06-19 NOTE — [facility] Counselor (Signed)
Adult Comprehensive Assessment  Patient ID: Francisco Barrett, male   DOB: [birth year], 41 y.o.   MRN: [id number]  Information Source: Information source: Patient  Current Stressors:  Patient states their primary concerns and needs for treatment are:: Patient states he feels hopeless and "fed up with things depressions, people do not want to hear about my problems." Patient states their goals for this hospitilization and ongoing recovery are:: Patient states he would like to be on medication, though he has concerns that a lack of insurance may prevent him from getting rx filled. Educational / Learning stressors: none reported Employment / Job issues: none reported Family Relationships: none reported Surveyor, quantity / Lack of resources (include bankruptcy): States he has no income. Housing / Lack of housing: Endorsees chronic homelessness Physical health (include injuries & life threatening diseases): "It is ok" Social relationships: States, "I have associates, only true friend is god" Substance abuse: Endorses poly substances use, see SUD section for further details. Bereavement / Loss: Reports the murder of his best friend 8 months ago, appears visibly bothered when providing details.  Living/Environment/Situation:  Living Arrangements: Alone Living conditions (as described by patient or guardian): Patient is homeless, lives with others  periodically Who else lives in the home?: n/a How long has patient lived in current situation?: 7 years What is atmosphere in current home: Temporary  Family History:  Marital status: Single Are you sexually active?:  (unknown) What is your sexual orientation?: heterosexual Does patient have children?: Yes How many children?: 3 How is patient's relationship with their children?: "good when I get to see them"  Childhood History:  By whom was/is the patient raised?: Mother/father and step-parent Description of patient's relationship with caregiver  when they were a child: Patient states he had a "good" relationship w/ parents growing up Patient's description of current relationship with people who raised him/her: Patient states that his relationship remains "good" with parents. Does patient have siblings?: Yes Number of Siblings: 5 Description of patient's current relationship with siblings: "pretty good for the most part, me an my oldest sister get into it " Did patient suffer any verbal/emotional/physical/sexual abuse as a child?: No Did patient suffer from severe childhood neglect?: No Has patient ever been sexually abused/assaulted/raped as an adolescent or adult?: No Was the patient ever a victim of a crime or a disaster?: No Witnessed domestic violence?: No Has patient been affected by domestic violence as an adult?: No  Education:  Highest grade of school patient has completed: Patient dropped out of high school, grade unknown. 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 If attempted suicide, did drugs/alcohol play a role in this?: Yes (Patient was intoxicated when attempting to walk into traffic.) Alcohol/Substance Abuse Treatment Hx: Attends AA/NA, Past Tx, Inpatient If yes, describe treatment: [facility], [facility] Has alcohol/substance abuse ever caused legal problems?: No  Social Support System:   [employer] Support System: None Type of faith/religion:  "I have not found that yet"  Leisure/Recreation:  Do You Have Hobbies?: No  Strengths/Needs:   Patient states these barriers may affect/interfere with their treatment: none reported Patient states these barriers may affect their return to the community: none reported Other important information patient would like considered in planning for their treatment: none reported  Discharge Plan:   Currently receiving community mental health services: No (interested in referral to [facility]) Does patient have access to transportation?: No Does patient have financial barriers related to discharge medications?: Yes (No ins.) Plan for no access to transportation at discharge: CSW to assist with transportation at discharge. 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 w. no listed ins; admitted after walking into traffic in attempt to kill himself. Patient acknowledges suicide attempt and states that he is hopeless and "fed up" that people do not care about his problems and that he is not getting any help from others. States he is interested in getting medication management and therapy. Primary stressors include interpersonal conflict w/ family, financial stress, unemployment, and bereavement. Patient denies childhood and adulthood trauma. Patient is currently unemployed, unable to maintain employment for long periods of time. UDS positive for amphetamines and marijuana. BAC 175. Endorses polysubstance use. Presents as calm and cooperative w/ euthymic mood. Endorses passive SI, denies HI/AVH. No evidence of memory or concentration impairment. Appearance is relatively WNL. Insight is good and Judgment is also good. Therapeutic recommendations include crisis  stabilization, medication management, group therapy, and case management.  Corky Crafts. 06/19/2021

## 2021-06-20 DIAGNOSIS — F332 Major depressive disorder, recurrent severe without psychotic features: Secondary | ICD-10-CM | POA: Diagnosis not present

## 2021-06-20 MED ORDER — MIRTAZAPINE 15 MG PO TABS
30.0000 mg | ORAL_TABLET | Freq: Every day | ORAL | Status: DC
Start: 2021-06-20 — End: 2021-06-24
  Administered 2021-06-20 – 2021-06-23 (×4): 30 mg via ORAL
  Filled 2021-06-20 (×4): qty 2

## 2021-06-20 NOTE — Group Note (Signed)
[facility] LCSW Group Therapy Note ° ° °Group Date: 06/20/2021 °Start Time: 1300 °End Time: 1400 ° °Type of Therapy and Topic:  Group Therapy:  Feelings around Relapse and Recovery ° °Participation Level:  Did Not Attend  ° °Mood: ° °Description of Group:   ° Patients in this group will discuss emotions they experience before and after a relapse. They will process how experiencing these feelings, or avoidance of experiencing them, relates to having a relapse. Facilitator will guide patients to explore emotions they have related to recovery. Patients will be encouraged to process which emotions are more powerful. They will be guided to discuss the emotional reaction significant others in their lives may have to patients’ relapse or recovery. Patients will be assisted in exploring ways to respond to the emotions of others without this contributing to a relapse. ° °Therapeutic Goals: °Patient will identify two or more emotions that lead to relapse for them:  °Patient will identify two emotions that result when they relapse:  °Patient will identify two emotions related to recovery:  °Patient will demonstrate ability to communicate their needs through discussion and/or role plays. ° ° °Summary of Patient Progress: ° °Patient did not attend group despite encouraged participation.  ° ° °Therapeutic Modalities:   °Cognitive Behavioral Therapy °Solution-Focused Therapy °Assertiveness Training °Relapse Prevention Therapy ° ° °Garnet Over[place] W Ruhi Kopke, LCSWA °

## 2021-06-20 NOTE — Progress Notes (Signed)
Recreation Therapy Notes  INPATIENT RECREATION THERAPY ASSESSMENT  Patient Details Name: Francisco Barrett MRN: [id number] DOB: December 05, 1980 Today's Date: 06/20/2021       Information Obtained From: Patient  Able to Participate in Assessment/Interview: Yes  Patient Presentation: Responsive  Reason for Admission (Per Patient): Active Symptoms  Patient Stressors:    Coping Skills:   Substance Abuse  Leisure Interests (2+):   (None)  Frequency of Recreation/Participation:    Awareness of Community Resources:     [employer]:     Current Use:    If no, Barriers?:    Expressed Interest in [employer] Information:    [employer] of Residence:  Insurance underwriter  Patient Main Form of Transportation: Diplomatic Services operational officer  Patient Strengths:  N/A  Patient Identified Areas of Improvement:  N/A  Patient Goal for Hospitalization:  Treatment  Current SI (including self-harm):  No  Current HI:  No  Current AVH: No  Staff Intervention Plan: Group Attendance, Collaborate with Interdisciplinary Treatment Team  Consent to Intern Participation: N/A  Francisco Barrett 06/20/2021, 3:59 PM

## 2021-06-20 NOTE — Progress Notes (Signed)
Recreation Therapy Notes  Date: 06/20/2021  Time: 10:10 am    Location: Courtyard    Behavioral response: Appropriate  Intervention Topic:  Relaxation    Discussion/Intervention:  Group content today was focused on relaxation. The group defined relaxation and identified healthy ways to relax. Individuals expressed how much time they spend relaxing. Patients expressed how much their life would be if they did not make time for themselves to relax. The group stated ways they could improve their relaxation techniques in the future.  Individuals participated in the intervention Time to Relax where they had a chance to experience different relaxation techniques.   Clinical Observations/Feedback: Patient came to group and was focused on what peers and staff had to say about relaxation. He stated that over thinking is what stop him from relaxing. Individual was social with peers and staff while participating in the intervention; but left group early.  Francisco Barrett LRT/CTRS         Francisco Barrett 06/20/2021 12:02 PM

## 2021-06-20 NOTE — Plan of Care (Signed)
Patient appropriate with staff and peers. Patient verbalized that his goal upon discharge is to get in a rehab program. Denies SI,HI and AVH. No PRN medications needed this shift. Appetite and energy level good. ADLs maintained. Support and encouragement given.

## 2021-06-20 NOTE — BH IP Treatment Plan (Signed)
Interdisciplinary Treatment and Diagnostic Plan Update  06/20/2021 Time of Session: 0930 Francisco Barrett MRN: [id number]  Principal Diagnosis: MDD (major depressive disorder), recurrent episode, severe (HCC)  Secondary Diagnoses: Principal Problem:   MDD (major depressive disorder), recurrent episode, severe (HCC) Active Problems:   Alcohol abuse   Cocaine abuse (HCC)   Essential hypertension   Current Medications:  Current Facility-Administered Medications  Medication Dose Route Frequency Provider Last Rate Last Admin   acetaminophen (TYLENOL) tablet 650 mg  650 mg Oral Q6H PRN Gillermo Murdoch, NP       alum & mag hydroxide-simeth (MAALOX/MYLANTA) 200-200-20 MG/5ML suspension 30 mL  30 mL Oral Q4H PRN Gillermo Murdoch, NP       amLODipine (NORVASC) tablet 5 mg  5 mg Oral Daily Clapacs, Jackquline Denmark, MD   5 mg at 06/20/21 2831   folic acid (FOLVITE) tablet 1 mg  1 mg Oral Daily Gillermo Murdoch, NP   1 mg at 06/20/21 0850   hydrOXYzine (ATARAX) tablet 50 mg  50 mg Oral Q6H PRN Clapacs, Jackquline Denmark, MD   50 mg at 06/19/21 5176   LORazepam (ATIVAN) tablet 1 mg  1 mg Oral Q6H PRN Gillermo Murdoch, NP       magnesium hydroxide (MILK OF MAGNESIA) suspension 30 mL  30 mL Oral Daily PRN Gillermo Murdoch, NP       mirtazapine (REMERON) tablet 15 mg  15 mg Oral QHS Clapacs, John T, MD   15 mg at 06/19/21 2109   multivitamin with minerals tablet 1 tablet  1 tablet Oral Daily Gillermo Murdoch, NP   1 tablet at 06/20/21 0850   nicotine (NICODERM CQ - dosed in mg/24 hours) patch 21 mg  21 mg Transdermal Daily Clapacs, Jackquline Denmark, MD   21 mg at 06/20/21 1607   PTA Medications: Medications Prior to Admission  Medication Sig Dispense Refill Last Dose   acetaminophen (TYLENOL) 325 MG tablet Take 2 tablets (650 mg total) by mouth every 6 (six) hours as needed for mild pain (or Fever >/= 101).       Patient Stressors: Marital or family conflict   Substance abuse    Patient  Strengths: Ability for insight  Motivation for treatment/growth   Treatment Modalities: Medication Management, Group therapy, Case management,  1 to 1 session with clinician, Psychoeducation, Recreational therapy.   Physician Treatment Plan for Primary Diagnosis: MDD (major depressive disorder), recurrent episode, severe (HCC) Long Term Goal(s): Improvement in symptoms so as ready for discharge   Short Term Goals: Ability to maintain clinical measurements within normal limits will improve Compliance with prescribed medications will improve Ability to disclose and discuss suicidal ideas Ability to demonstrate self-control will improve  Medication Management: Evaluate patient's response, side effects, and tolerance of medication regimen.  Therapeutic Interventions: 1 to 1 sessions, Unit Group sessions and Medication administration.  Evaluation of Outcomes: Progressing  Physician Treatment Plan for Secondary Diagnosis: Principal Problem:   MDD (major depressive disorder), recurrent episode, severe (HCC) Active Problems:   Alcohol abuse   Cocaine abuse (HCC)   Essential hypertension  Long Term Goal(s): Improvement in symptoms so as ready for discharge   Short Term Goals: Ability to maintain clinical measurements within normal limits will improve Compliance with prescribed medications will improve Ability to disclose and discuss suicidal ideas Ability to demonstrate self-control will improve     Medication Management: Evaluate patient's response, side effects, and tolerance of medication regimen.  Therapeutic Interventions: 1 to 1 sessions, Unit Group sessions and  Medication administration.  Evaluation of Outcomes: Progressing   RN Treatment Plan for Primary Diagnosis: MDD (major depressive disorder), recurrent episode, severe (HCC) Long Term Goal(s): Knowledge of disease and therapeutic regimen to maintain health will improve  Short Term Goals: Ability to remain free from  injury will improve, Ability to verbalize frustration and anger appropriately will improve, Ability to demonstrate self-control, Ability to participate in decision making will improve, Ability to verbalize feelings will improve, Ability to disclose and discuss suicidal ideas, Ability to identify and develop effective coping behaviors will improve, and Compliance with prescribed medications will improve  Medication Management: RN will administer medications as ordered by provider, will assess and evaluate patient's response and provide education to patient for prescribed medication. RN will report any adverse and/or side effects to prescribing provider.  Therapeutic Interventions: 1 on 1 counseling sessions, Psychoeducation, Medication administration, Evaluate responses to treatment, Monitor vital signs and CBGs as ordered, Perform/monitor CIWA, COWS, AIMS and Fall Risk screenings as ordered, Perform wound care treatments as ordered.  Evaluation of Outcomes: Progressing   LCSW Treatment Plan for Primary Diagnosis: MDD (major depressive disorder), recurrent episode, severe (HCC) Long Term Goal(s): Safe transition to appropriate next level of care at discharge, Engage patient in therapeutic group addressing interpersonal concerns.  Short Term Goals: Engage patient in aftercare planning with referrals and resources, Increase social support, Increase ability to appropriately verbalize feelings, Increase emotional regulation, Facilitate acceptance of mental health diagnosis and concerns, Facilitate patient progression through stages of change regarding substance use diagnoses and concerns, and Identify triggers associated with mental health/substance abuse issues  Therapeutic Interventions: Assess for all discharge needs, 1 to 1 time with Social worker, Explore available resources and support systems, Assess for adequacy in community support network, Educate family and significant other(s) on suicide  prevention, Complete Psychosocial Assessment, Interpersonal group therapy.  Evaluation of Outcomes: Progressing   Progress in Treatment: Attending groups: No. Participating in groups: No. Taking medication as prescribed: Yes. Toleration medication: Yes. Family/Significant other contact made: No, will contact:  CSW will reach brother Quinto Tippy. Patient understands diagnosis: Yes. Discussing patient identified problems/goals with staff: Yes. Medical problems stabilized or resolved: Yes. Denies suicidal/homicidal ideation: No. Issues/concerns per patient self-inventory: Yes. Other: none  New problem(s) identified: No, Describe:  None, patient reiterates that he would like to address his addiction to illicit substances.   New Short Term/Long Term Goal(s): Patient to work towards detox, medication management for mood stabilization; elimination of SI thoughts; development of comprehensive mental wellness/sobriety plan.  Patient Goals:  Patient states, "well number one, <I would like to work on> my addiction, second my health, just those two things, I do not want to overwhelm myself.    Discharge Plan or Barriers: Patient is homeless, plans on calling his brother to determine if he can move there after discharge. Otherwise, CSW will continue to monitor housing situation and discuss residential SUD tx options as need.   Reason for Continuation of Hospitalization: Depression Suicidal ideation Other; describe Active Substance Use   Estimated Length of Stay: 1-7 days    Scribe for Treatment Team: Almedia Balls 06/20/2021 9:45 AM

## 2021-06-20 NOTE — Progress Notes (Signed)
Recreation Therapy Notes  INPATIENT RECREATION TR PLAN  Patient Details Name: Francisco Barrett MRN: [id number] DOB: Mar 16, 1981 Today's Date: 06/20/2021  Rec Therapy Plan Is patient appropriate for Therapeutic Recreation?: Yes Treatment times per week: at least 3 Estimated Length of Stay: 5-7 days TR Treatment/Interventions: Group participation (Comment)  Discharge Criteria Pt will be discharged from therapy if:: Discharged Treatment plan/goals/alternatives discussed and agreed upon by:: Patient/family  Discharge Summary     [name] 06/20/2021, 4:01 PM

## 2021-06-20 NOTE — Progress Notes (Signed)
[facility] MD Progress Note  06/20/2021 10:48 AM Francisco Barrett  MRN:  [id number] Subjective: Follow-up 41 year old man with major depression and substance abuse.  Patient seen treatment team met as well.  Patient reports mood is stable.  Still some depression no active intent to harm himself.  Patient very much would like to get involved in substance abuse treatment and wants to review options for rehab treatment.  Tolerating medication well.  No physical complaints. Principal Problem: MDD (major depressive disorder), recurrent episode, severe ([place]) Diagnosis: Principal Problem:   MDD (major depressive disorder), recurrent episode, severe ([place]) Active Problems:   Alcohol abuse   Cocaine abuse ([place])   Essential hypertension  Total Time spent with patient: 30 minutes  Past Psychiatric History: Past history of recurrent depression and ongoing substance use issues  Past Medical History:  Past Medical History:  Diagnosis Date   H/O ETOH abuse    Tobacco abuse    History reviewed. No pertinent surgical history. Family History: History reviewed. No pertinent family history. Family Psychiatric  History: See previous Social History:  Social History   Substance and Sexual Activity  Alcohol Use Yes   Comment: 2 40's     Social History   Substance and Sexual Activity  Drug Use Yes   Types: Marijuana, Cocaine    Social History   Socioeconomic History   Marital status: Single    Spouse name: Not on file   Number of children: Not on file   Years of education: Not on file   Highest education level: Not on file  Occupational History   Not on file  Tobacco Use   Smoking status: Every Day    Packs/day: 0.50    Types: Cigarettes   Smokeless tobacco: Never  Vaping Use   Vaping Use: Never used  Substance and Sexual Activity   Alcohol use: Yes    Comment: 2 40's   Drug use: Yes    Types: Marijuana, Cocaine   Sexual activity: Not on file  Other Topics Concern   Not on file   Social History Narrative   Not on file   Social Determinants of Health   Financial Resource Strain: Not on file  Food Insecurity: Not on file  Transportation Needs: Not on file  Physical Activity: Not on file  Stress: Not on file  Social Connections: Not on file   Additional Social History:                         Sleep: Fair  Appetite:  Fair  Current Medications: Current Facility-Administered Medications  Medication Dose Route Frequency Provider Last Rate Last Admin   acetaminophen (TYLENOL) tablet 650 mg  650 mg Oral Q6H PRN Caroline Sauger, NP       alum & mag hydroxide-simeth (MAALOX/MYLANTA) 200-200-20 MG/5ML suspension 30 mL  30 mL Oral Q4H PRN Caroline Sauger, NP       amLODipine (NORVASC) tablet 5 mg  5 mg Oral Daily Shaneca Orne T, MD   5 mg at 56/97/94 8016   folic acid (FOLVITE) tablet 1 mg  1 mg Oral Daily Caroline Sauger, NP   1 mg at 06/20/21 0850   hydrOXYzine (ATARAX) tablet 50 mg  50 mg Oral Q6H PRN Fiona Coto, Madie Reno, MD   50 mg at 06/19/21 0904   LORazepam (ATIVAN) tablet 1 mg  1 mg Oral Q6H PRN Caroline Sauger, NP       magnesium hydroxide (MILK OF MAGNESIA) suspension  30 mL  30 mL Oral Daily PRN Caroline Sauger, NP       mirtazapine (REMERON) tablet 15 mg  15 mg Oral QHS Mardy Hoppe, Madie Reno, MD   15 mg at 06/19/21 2109   multivitamin with minerals tablet 1 tablet  1 tablet Oral Daily Caroline Sauger, NP   1 tablet at 06/20/21 0850   nicotine (NICODERM CQ - dosed in mg/24 hours) patch 21 mg  21 mg Transdermal Daily Zamora Colton, Madie Reno, MD   21 mg at 06/20/21 2778    Lab Results: No results found for this or any previous visit (from the past 48 hour(s)).  Blood Alcohol level:  Lab Results  Component Value Date   ETH 175 (H) 06/18/2021   ETH 403 (HH) 24/23/5361    Metabolic Disorder Labs: No results found for: HGBA1C, MPG No results found for: PROLACTIN No results found for: CHOL, TRIG, HDL, CHOLHDL, VLDL,  LDLCALC  Physical Findings: AIMS:  , ,  ,  ,    CIWA:  CIWA-Ar Total: 0 COWS:     Musculoskeletal: Strength & Muscle Tone: within normal limits Gait & Station: normal Patient leans: N/A  Psychiatric Specialty Exam:  Presentation  General Appearance: Appropriate for Environment  Eye Contact:Poor  Speech:Clear and Coherent  Speech Volume:Normal  Handedness:No data recorded  Mood and Affect  Mood:Depressed; Worthless  Affect:Blunt; Congruent   Thought Process  Thought Processes:Coherent  Descriptions of Associations:Intact  Orientation:Full (Time, Place and Person)  Thought Content:WDL  History of Schizophrenia/Schizoaffective disorder:No  Duration of Psychotic Symptoms:No data recorded Hallucinations:No data recorded Ideas of Reference:None  Suicidal Thoughts:No data recorded Homicidal Thoughts:No data recorded  Sensorium  Memory:Immediate Good  Judgment:Fair  Insight:Fair   Executive Functions  Concentration:Fair  Attention Span:Good  [place]  Language:Good   Psychomotor Activity  Psychomotor Activity:No data recorded  Assets  Assets:Desire for Improvement   Sleep  Sleep:No data recorded   Physical Exam: Physical Exam Vitals and nursing note reviewed.  Constitutional:      Appearance: Normal appearance.  HENT:     Head: Normocephalic and atraumatic.     Mouth/Throat:     Pharynx: Oropharynx is clear.  Eyes:     Pupils: Pupils are equal, round, and reactive to light.  Cardiovascular:     Rate and Rhythm: Normal rate and regular rhythm.  Pulmonary:     Effort: Pulmonary effort is normal.     Breath sounds: Normal breath sounds.  Abdominal:     General: Abdomen is flat.     Palpations: Abdomen is soft.  Musculoskeletal:        General: Normal range of motion.  Skin:    General: Skin is warm and dry.  Neurological:     General: No focal deficit present.     Mental Status: He is alert.  Mental status is at baseline.  Psychiatric:        Mood and Affect: Mood normal.        Thought Content: Thought content normal.   Review of Systems  Constitutional: Negative.   HENT: Negative.    Eyes: Negative.   Respiratory: Negative.    Cardiovascular: Negative.   Gastrointestinal: Negative.   Musculoskeletal: Negative.   Skin: Negative.   Neurological: Negative.   Psychiatric/Behavioral:  Positive for depression. Negative for hallucinations, substance abuse and suicidal ideas. The patient is not nervous/anxious.   Blood pressure (!) 147/95, pulse (!) 51, temperature 97.6 F (36.4 C), temperature source Oral, resp. rate 17,  height 5' 3"  (1.6 m), weight 58 kg, SpO2 100 %. Body mass index is 22.65 kg/m.   Treatment Plan Summary: Medication management and Plan continue medication for depression.  Engage patient in individual and group activities on the unit.  Plan to likely increase the mirtazapine to 30 mg.  Patient will meet with social work to discuss options for rehab treatment.  Possible discharge by early to mid next week  Alethia Berthold, MD 06/20/2021, 10:48 AM

## 2021-06-21 DIAGNOSIS — F332 Major depressive disorder, recurrent severe without psychotic features: Secondary | ICD-10-CM | POA: Diagnosis not present

## 2021-06-21 NOTE — Group Note (Signed)
LCSW Group Therapy Note ° ° °Group Date: 06/21/2021 °Start Time: 1300 °End Time: 1400 ° ° °Type of Therapy and Topic:  Group Therapy: Boundaries ° °Participation Level:  Did Not Attend ° °Description of Group: °This group will address the use of boundaries in their personal lives. Patients will explore why boundaries are important, the difference between healthy and unhealthy boundaries, and negative and postive outcomes of different boundaries and will look at how boundaries can be crossed.  Patients will be encouraged to identify current boundaries in their own lives and identify what kind of boundary is being set. Facilitators will guide patients in utilizing problem-solving interventions to address and correct types boundaries being used and to address when no boundary is being used. Understanding and applying boundaries will be explored and addressed for obtaining and maintaining a balanced life. Patients will be encouraged to explore ways to assertively make their boundaries and needs known to significant others in their lives, using other group members and facilitator for role play, support, and feedback. ° °Therapeutic Goals: ° °1.  Patient will identify areas in their life where setting clear boundaries could be  used to improve their life.  °2.  Patient will identify signs/triggers that a boundary is not being respected. °3.  Patient will identify two ways to set boundaries in order to achieve balance in  their lives: °4.  Patient will demonstrate ability to communicate their needs and set boundaries  through discussion and/or role plays ° °Summary of Patient Progress:  Group was not held due to limited staff available to conduct group. Group will resume at earliest date possible.  ° °Therapeutic Modalities:   °Cognitive Behavioral Therapy °Solution-Focused Therapy ° °Belissa Kooy W Jamaiyah Pyle, LCSWA °06/21/2021  10:36 PM   ° °

## 2021-06-21 NOTE — Progress Notes (Signed)
Patient alert and oriented x 4, affect is blunted thoughts are organized and coherent, he denies SI/HI/AVH, patient interacting appropriately with peers and staff.  No distress noted, 15 minutes safety checks maintained will continue to monitor.

## 2021-06-21 NOTE — Plan of Care (Signed)
  Problem: Education: Goal: Knowledge of [facility] Education information/materials will improve Outcome: Progressing Goal: Emotional status will improve Outcome: Progressing Goal: Mental status will improve Outcome: Progressing Goal: Verbalization of understanding the information provided will improve Outcome: Progressing   Problem: Safety: Goal: Periods of time without injury will increase Outcome: Progressing   Problem: Education: Goal: Utilization of techniques to improve thought processes will improve Outcome: Progressing Goal: Knowledge of the prescribed therapeutic regimen will improve Outcome: Progressing   Problem: Safety: Goal: Ability to disclose and discuss suicidal ideas will improve Outcome: Progressing Goal: Ability to identify and utilize support systems that promote safety will improve Outcome: Progressing   

## 2021-06-21 NOTE — Progress Notes (Signed)
IN 1:1 pt stated he has had chronic history of depression, anxiety and substance abuse. He has 2 kids, 3 sister and 2 brothers. His mother lives in low income housing and his dad is deceased. He was living with his brother, but his drinking and bad money habits got in the way. Broke up with his girlfriend of 15 years, she lives in [place]. He also has grief/oss issues, he a friend to murder last year and father died 7 years ago of head trauma. He is homeless, no transportation and might has lost his job at [employer] due to being hospitalized. He report rt arm numbness in the past, none currently. He wants to get into a substance abuse program.

## 2021-06-21 NOTE — Plan of Care (Signed)
°  Problem: Education: Goal: Mental status will improve Outcome: Progressing  Patient has more insight into his mental health situation, he denies SI/HI/AVH.

## 2021-06-21 NOTE — Progress Notes (Signed)
[facility] MD Progress Note  06/21/2021 12:32 PM Francisco Barrett  MRN:  [id number]  Principal Problem: MDD (major depressive disorder), recurrent episode, severe ([facility]) Diagnosis: Principal Problem:   MDD (major depressive disorder), recurrent episode, severe ([facility]) Active Problems:   Alcohol abuse   Cocaine abuse ([facility])   Essential hypertension  Patient is a 41 y.o. man who presents to the [facility] unit due to depression and substance abuse.   Interval History Patient was seen today for re-evaluation.  Nursing reports no events overnight. The patient has no issues with performing ADLs.  Patient has been medication compliant.    Subjective:  On assessment patient reports "I feel better". He slept better and feeling less depressed after last med dose adjustment. He denies suicidal/homicidal ideations. He denies auditory/visual hallucinations. The patient reports no side effects from medications. No physical complaints. Denies withdrawal symptoms from any substances. He is interested in getting involved in substance abuse treatment and wants to review options for rehab treatment.  Labs: no new results for review.     Total Time spent with patient: 20 minutes  Past Psychiatric History: see H&P   Past Medical History:  Past Medical History:  Diagnosis Date   H/O ETOH abuse    Tobacco abuse    History reviewed. No pertinent surgical history. Family History: History reviewed. No pertinent family history. Family Psychiatric  History: see H&P Social History:  Social History   Substance and Sexual Activity  Alcohol Use Yes   Comment: 2 40's     Social History   Substance and Sexual Activity  Drug Use Yes   Types: Marijuana, Cocaine    Social History   Socioeconomic History   Marital status: Single    Spouse name: Not on file   Number of children: Not on file   Years of education: Not on file   Highest education level: Not on file  Occupational History   Not on file   Tobacco Use   Smoking status: Every Day    Packs/day: 0.50    Types: Cigarettes   Smokeless tobacco: Never  Vaping Use   Vaping Use: Never used  Substance and Sexual Activity   Alcohol use: Yes    Comment: 2 40's   Drug use: Yes    Types: Marijuana, Cocaine   Sexual activity: Not on file  Other Topics Concern   Not on file  Social History Narrative   Not on file   Social Determinants of Health   Financial Resource Strain: Not on file  Food Insecurity: Not on file  Transportation Needs: Not on file  Physical Activity: Not on file  Stress: Not on file  Social Connections: Not on file   Additional Social History:                         Sleep: Fair  Appetite:  Good  Current Medications: Current Facility-Administered Medications  Medication Dose Route Frequency Provider Last Rate Last Admin   acetaminophen (TYLENOL) tablet 650 mg  650 mg Oral Q6H PRN Gillermo Murdoch, NP       alum & mag hydroxide-simeth (MAALOX/MYLANTA) 200-200-20 MG/5ML suspension 30 mL  30 mL Oral Q4H PRN Gillermo Murdoch, NP       amLODipine (NORVASC) tablet 5 mg  5 mg Oral Daily Clapacs, Jackquline Denmark, MD   5 mg at 06/21/21 7829   folic acid (FOLVITE) tablet 1 mg  1 mg Oral Daily Gillermo Murdoch, NP  1 mg at 06/21/21 0835   hydrOXYzine (ATARAX) tablet 50 mg  50 mg Oral Q6H PRN Clapacs, John T, MD   50 mg at 06/19/21 [id number]   LORazepam (ATIVAN) tablet 1 mg  1 mg Oral Q6H PRN Caroline Sauger, NP       magnesium hydroxide (MILK OF MAGNESIA) suspension 30 mL  30 mL Oral Daily PRN Caroline Sauger, NP       mirtazapine (REMERON) tablet 30 mg  30 mg Oral QHS Clapacs, John T, MD   30 mg at 06/20/21 2125   multivitamin with minerals tablet 1 tablet  1 tablet Oral Daily Caroline Sauger, NP   1 tablet at 06/21/21 [id number]   nicotine (NICODERM CQ - dosed in mg/24 hours) patch 21 mg  21 mg Transdermal Daily Clapacs, Madie Reno, MD   21 mg at 06/21/21 [id number]    Lab Results: No results found  for this or any previous visit (from the past 48 hour(s)).  Blood Alcohol level:  Lab Results  Component Value Date   ETH 175 (H) 06/18/2021   ETH 403 (HH) [id number]    Metabolic Disorder Labs: No results found for: HGBA1C, MPG No results found for: PROLACTIN No results found for: CHOL, TRIG, HDL, CHOLHDL, VLDL, LDLCALC  Physical Findings: AIMS:  , ,  ,  ,    CIWA:  CIWA-Ar Total: 0 COWS:     Musculoskeletal: Strength & Muscle Tone: within normal limits Gait & Station: normal Patient leans: N/A  Psychiatric Specialty Exam:  Presentation  General Appearance: Appropriate for Environment  Eye Contact: good  Speech:Clear and Coherent  Speech Volume:Normal  Handedness:No data recorded  Mood and Affect  Mood: "better"  Affect: full  Thought Process  Thought Processes:Coherent  Descriptions of Associations:Intact  Orientation:Full (Time, Place and Person)  Thought Content:WDL  History of Schizophrenia/Schizoaffective disorder:No  Duration of Psychotic Symptoms:No data recorded Hallucinations: denies Ideas of Reference:None  Suicidal Thoughts: denies Homicidal Thoughts: denies  Sensorium  Memory:Immediate Good  Judgment:Fair  Insight:Fair   Executive Functions  Concentration:Fair  Attention Span:Good  [place]  Language:Good   Psychomotor Activity  Psychomotor Activity:No data recorded  Assets  Assets:Desire for Improvement   Sleep  Sleep:No data recorded   Physical Exam: Physical Exam ROS Blood pressure 122/77, pulse 60, temperature 97.6 F (36.4 C), temperature source Oral, resp. rate 18, height 5\' 3"  (1.6 m), weight 58 kg, SpO2 100 %. Body mass index is 22.65 kg/m.   Treatment Plan Summary: Daily contact with patient to assess and evaluate symptoms and progress in treatment and Medication management Patient is a 41 year old mam with the above-stated past psychiatric history who is seen in  follow-up.  Chart reviewed. Patient discussed with nursing. Patient reports mood ans sleep improvement after last adjustment of medicine. No changes in medicine today. Patient will meet with social work to discuss options for rehab treatment.  Possible discharge by early to mid next week      Plan:  -continue inpatient psych admission; 15-minute checks; daily contact with patient to assess and evaluate symptoms and progress in treatment; psychoeducation.  -continue scheduled medications:  amLODipine  5 mg Oral Daily   folic acid  1 mg Oral Daily   mirtazapine  30 mg Oral QHS   multivitamin with minerals  1 tablet Oral Daily   nicotine  21 mg Transdermal Daily    -continue PRN medications.  acetaminophen, alum & mag hydroxide-simeth, hydrOXYzine, LORazepam, magnesium hydroxide  -Disposition: Estimated duration of  hospitalization: early to midweek next week. Social worker to help patient get into rehab program. All necessary aftercare will be arranged prior to discharge.  -  I certify that the patient does need, on a daily basis, active treatment furnished directly by or requiring the supervision of inpatient psychiatric facility personnel.    Larita Fife, MD 06/21/2021, 12:32 PM

## 2021-06-21 NOTE — Progress Notes (Signed)
Pt ate breakfast in the dining room and then went back to be. He denies SI/HI or AVH. No complaints of pain, anxiety or depression.

## 2021-06-22 DIAGNOSIS — F332 Major depressive disorder, recurrent severe without psychotic features: Secondary | ICD-10-CM | POA: Diagnosis not present

## 2021-06-22 NOTE — Progress Notes (Signed)
[facility] MD Progress Note  06/22/2021 11:30 AM Francisco Barrett  MRN:  [id number]  Principal Problem: MDD (major depressive disorder), recurrent episode, severe (HCC) Diagnosis: Principal Problem:   MDD (major depressive disorder), recurrent episode, severe (HCC) Active Problems:   Alcohol abuse   Cocaine abuse (HCC)   Essential hypertension  Patient is a 41 y.o. man who presents to the [facility] unit due to depression and substance abuse.   Interval History Patient was seen today for re-evaluation.  Nursing reports no events overnight. The patient has no issues with performing ADLs.  Patient has been medication compliant.    Subjective:  On assessment patient reports "I feel good. I am trying to sleep and you are the third person who comes in". He continues to report feeling less depressed after last med dose adjustment. He denies suicidal/homicidal ideations. He denies auditory/visual hallucinations. The patient reports no side effects from medications. No physical complaints. Denies withdrawal symptoms from any substances. He is interested in getting involved in substance abuse treatment and wants to review options for rehab treatment.  Labs: no new results for review.     Total Time spent with patient: 20 minutes  Past Psychiatric History: see H&P   Past Medical History:  Past Medical History:  Diagnosis Date   H/O ETOH abuse    Tobacco abuse    History reviewed. No pertinent surgical history. Family History: History reviewed. No pertinent family history. Family Psychiatric  History: see H&P Social History:  Social History   Substance and Sexual Activity  Alcohol Use Yes   Comment: 2 40's     Social History   Substance and Sexual Activity  Drug Use Yes   Types: Marijuana, Cocaine    Social History   Socioeconomic History   Marital status: Single    Spouse name: Not on file   Number of children: Not on file   Years of education: Not on file   Highest  education level: Not on file  Occupational History   Not on file  Tobacco Use   Smoking status: Every Day    Packs/day: 0.50    Types: Cigarettes   Smokeless tobacco: Never  Vaping Use   Vaping Use: Never used  Substance and Sexual Activity   Alcohol use: Yes    Comment: 2 40's   Drug use: Yes    Types: Marijuana, Cocaine   Sexual activity: Not on file  Other Topics Concern   Not on file  Social History Narrative   Not on file   Social Determinants of Health   Financial Resource Strain: Not on file  Food Insecurity: Not on file  Transportation Needs: Not on file  Physical Activity: Not on file  Stress: Not on file  Social Connections: Not on file   Additional Social History:                         Sleep: Fair  Appetite:  Good  Current Medications: Current Facility-Administered Medications  Medication Dose Route Frequency Provider Last Rate Last Admin   acetaminophen (TYLENOL) tablet 650 mg  650 mg Oral Q6H PRN Gillermo Murdoch, NP       alum & mag hydroxide-simeth (MAALOX/MYLANTA) 200-200-20 MG/5ML suspension 30 mL  30 mL Oral Q4H PRN Gillermo Murdoch, NP       amLODipine (NORVASC) tablet 5 mg  5 mg Oral Daily Clapacs, Jackquline Denmark, MD   5 mg at 06/21/21 0835   folic acid (  FOLVITE) tablet 1 mg  1 mg Oral Daily Gillermo Murdoch, NP   1 mg at 06/21/21 0258   hydrOXYzine (ATARAX) tablet 50 mg  50 mg Oral Q6H PRN Clapacs, John T, MD   50 mg at 06/19/21 5277   magnesium hydroxide (MILK OF MAGNESIA) suspension 30 mL  30 mL Oral Daily PRN Gillermo Murdoch, NP       mirtazapine (REMERON) tablet 30 mg  30 mg Oral QHS Clapacs, John T, MD   30 mg at 06/21/21 2215   multivitamin with minerals tablet 1 tablet  1 tablet Oral Daily Gillermo Murdoch, NP   1 tablet at 06/21/21 8242   nicotine (NICODERM CQ - dosed in mg/24 hours) patch 21 mg  21 mg Transdermal Daily Clapacs, Jackquline Denmark, MD   21 mg at 06/21/21 3536    Lab Results: No results found for this or any  previous visit (from the past 48 hour(s)).  Blood Alcohol level:  Lab Results  Component Value Date   ETH 175 (H) 06/18/2021   ETH 403 (HH) 12/23/2020    Metabolic Disorder Labs: No results found for: HGBA1C, MPG No results found for: PROLACTIN No results found for: CHOL, TRIG, HDL, CHOLHDL, VLDL, LDLCALC  Physical Findings: AIMS:  , ,  ,  ,    CIWA:  CIWA-Ar Total: 0 COWS:     Musculoskeletal: Strength & Muscle Tone: within normal limits Gait & Station: normal Patient leans: N/A  Psychiatric Specialty Exam:  Presentation  General Appearance: Appropriate for Environment  Eye Contact: good  Speech:Clear and Coherent  Speech Volume:Normal  Handedness:No data recorded  Mood and Affect  Mood: "better"  Affect: full  Thought Process  Thought Processes:Coherent  Descriptions of Associations:Intact  Orientation:Full (Time, Place and Person)  Thought Content:WDL  History of Schizophrenia/Schizoaffective disorder:No  Duration of Psychotic Symptoms:No data recorded Hallucinations: denies Ideas of Reference:None  Suicidal Thoughts: denies Homicidal Thoughts: denies  Sensorium  Memory:Immediate Good  Judgment:Fair  Insight:Fair   Executive Functions  Concentration:Fair  Attention Span:Good  Recall:Good  Fund of Knowledge:Fair  Language:Good   Psychomotor Activity  Psychomotor Activity:No data recorded  Assets  Assets:Desire for Improvement   Sleep  Sleep:No data recorded   Physical Exam: Physical Exam ROS Blood pressure (!) 123/96, pulse 77, temperature 97.7 F (36.5 C), temperature source Oral, resp. rate 18, height 5\' 3"  (1.6 m), weight 58 kg, SpO2 100 %. Body mass index is 22.65 kg/m.   Treatment Plan Summary: Daily contact with patient to assess and evaluate symptoms and progress in treatment and Medication management Patient is a 41 year old mam with the above-stated past psychiatric history who is seen in follow-up.  Chart  reviewed. Patient discussed with nursing. Patient reports mood ans sleep improvement after last adjustment of medicine. No changes in medicine today. Patient will meet with social work to discuss options for rehab treatment.  Possible discharge by early to mid next week      Plan:  -continue inpatient psych admission; 15-minute checks; daily contact with patient to assess and evaluate symptoms and progress in treatment; psychoeducation.  -continue scheduled medications:  amLODipine  5 mg Oral Daily   folic acid  1 mg Oral Daily   mirtazapine  30 mg Oral QHS   multivitamin with minerals  1 tablet Oral Daily   nicotine  21 mg Transdermal Daily    -continue PRN medications.  acetaminophen, alum & mag hydroxide-simeth, hydrOXYzine, magnesium hydroxide  -Disposition: Estimated duration of hospitalization: early to midweek next week.  Social worker to help patient get into rehab program. All necessary aftercare will be arranged prior to discharge.  -  I certify that the patient does need, on a daily basis, active treatment furnished directly by or requiring the supervision of inpatient psychiatric facility personnel.    Thalia Party, MD 06/22/2021, 11:30 AM

## 2021-06-22 NOTE — Progress Notes (Signed)
Patient came up to get his morning medication before going down to eat lunch. Patient tolerated medication administration well, without any issues. Patient remains safe on the unit at this time.

## 2021-06-22 NOTE — Progress Notes (Signed)
Patient stated that he would get up and take his morning medication a little later. MD will be notified.

## 2021-06-22 NOTE — Progress Notes (Signed)
Patient alert and oriented x 4, no distress noted, he denies SI/HI/AVH compliant with medication regimen, interacting appropriately with peers and staff. 15 minutes safety checks maintained will continue to monitor

## 2021-06-22 NOTE — [facility] Suicide Risk Assessment (Signed)
[facility] INPATIENT:  Family/Significant Other Suicide Prevention Education  Suicide Prevention Education:  Contact Attempts: Francisco Barrett (Brother) [phone] has been identified by the patient as the family member/significant other with whom the patient will be residing, and identified as the person(s) who will aid the patient in the event of a mental health crisis.  With written consent from the patient, two attempts were made to provide suicide prevention education, prior to and/or following the patient's discharge.  We were unsuccessful in providing suicide prevention education.  A suicide education pamphlet was given to the patient to share with family/significant other.  Date and time of first attempt: 06/22/2021 3:20PM Date and time of second attempt: Additional attempt is needed at a later time.   This CSW placed call to [name], phone did not trill, relaying an automated message stating, "The person you are trying to reach is not accepting calls at this time. Please try again later." Unable to leave voicemail. CSW to make additional attempts at a later time.    [name] 06/22/2021, 3:22 PM

## 2021-06-22 NOTE — Plan of Care (Signed)
D- Patient alert and oriented. Patient presented in a pleasant mood on assessment stating that he slept "fairly well" last night and had some complaints of toe cramps, however, he just wanted to lay in bed and rest for a while. Patient denied any signs/symptoms of depression/anxiety to this writer, but he did endorse them both on his self-inventory. Patient did not divulge any of this information to this Probation officer. Although, he did request PRN medication for anxiety after lunch, but did not go into detail of why he was anxious. Patient also denied SI, HI, AVH at this time. Patient reported that overall, he is feeling "pretty good" today. Patient's goal for today is to "stay focused".  A- Scheduled medications administered to patient, per MD orders. Support and encouragement provided.  Routine safety checks conducted every 15 minutes.  Patient informed to notify staff with problems or concerns.  R- No adverse drug reactions noted. Patient contracts for safety at this time. Patient compliant with medications and treatment plan. Patient receptive, calm, and cooperative. Patient interacts well with others on the unit.  Patient remains safe at this time.  Problem: Education: Goal: Knowledge of [place] [facility] Education information/materials will improve Outcome: Progressing Goal: Emotional status will improve Outcome: Progressing Goal: Mental status will improve Outcome: Progressing Goal: Verbalization of understanding the information provided will improve Outcome: Progressing   Problem: Safety: Goal: Periods of time without injury will increase Outcome: Progressing   Problem: Education: Goal: Utilization of techniques to improve thought processes will improve Outcome: Progressing Goal: Knowledge of the prescribed therapeutic regimen will improve Outcome: Progressing   Problem: Safety: Goal: Ability to disclose and discuss suicidal ideas will improve Outcome: Progressing Goal: Ability to  identify and utilize support systems that promote safety will improve Outcome: Progressing

## 2021-06-22 NOTE — Group Note (Signed)
LCSW Group Therapy Note  Group Date: 06/22/2021 Start Time: 1300 End Time: 1400   Type of Therapy and Topic:  Group Therapy - Healthy vs Unhealthy Coping Skills  Participation Level:  Did Not Attend   Description of Group The focus of this group was to determine what unhealthy coping techniques typically are used by group members and what healthy coping techniques would be helpful in coping with various problems. Patients were guided in becoming aware of the differences between healthy and unhealthy coping techniques. Patients were asked to identify 2-3 healthy coping skills they would like to learn to use more effectively.  Therapeutic Goals Patients learned that coping is what human beings do all day long to deal with various situations in their lives Patients defined and discussed healthy vs unhealthy coping techniques Patients identified their preferred coping techniques and identified whether these were healthy or unhealthy Patients determined 2-3 healthy coping skills they would like to become more familiar with and use more often. Patients provided support and ideas to each other   Summary of Patient Progress: Patient did not attend group despite encouraged participation.   Therapeutic Modalities Cognitive Behavioral Therapy Motivational Interviewing  Marletta Lor 06/22/2021  2:47 PM

## 2021-06-23 ENCOUNTER — Other Ambulatory Visit: Payer: Self-pay

## 2021-06-23 DIAGNOSIS — F332 Major depressive disorder, recurrent severe without psychotic features: Secondary | ICD-10-CM | POA: Diagnosis not present

## 2021-06-23 MED ORDER — AMLODIPINE BESYLATE 5 MG PO TABS
10.0000 mg | ORAL_TABLET | Freq: Every day | ORAL | Status: DC
  Administered 2021-06-24: 10 mg via ORAL
  Filled 2021-06-23: qty 2

## 2021-06-23 MED ORDER — MIRTAZAPINE 30 MG PO TABS
30.0000 mg | ORAL_TABLET | Freq: Every day | ORAL | 0 refills | Status: DC
  Filled 2021-06-23: qty 10, 10d supply, fill #0

## 2021-06-23 MED ORDER — NICOTINE 21 MG/24HR TD PT24
21.0000 mg | MEDICATED_PATCH | Freq: Every day | TRANSDERMAL | 0 refills | Status: DC
  Filled 2021-06-23: qty 14, 14d supply, fill #0

## 2021-06-23 MED ORDER — AMLODIPINE BESYLATE 10 MG PO TABS
10.0000 mg | ORAL_TABLET | Freq: Every day | ORAL | 0 refills | Status: DC
Start: 2021-06-24 — End: 2021-06-24
  Filled 2021-06-23: qty 10, 10d supply, fill #0

## 2021-06-23 MED ORDER — HYDROXYZINE HCL 50 MG PO TABS
50.0000 mg | ORAL_TABLET | Freq: Four times a day (QID) | ORAL | 0 refills | Status: DC | PRN
  Filled 2021-06-23: qty 20, 5d supply, fill #0

## 2021-06-23 NOTE — Group Note (Signed)
LCSW Group Therapy Note  Group Date: 06/23/2021 Start Time: 1300 End Time: 1400   Type of Therapy and Topic:  Group Therapy - How To Cope with Nervousness about Discharge   Participation Level:  Active   Description of Group This process group involved identification of patients' feelings about discharge. Some of them are scheduled to be discharged soon, while others are new admissions, but each of them was asked to share thoughts and feelings surrounding discharge from the hospital. One common theme was that they are excited at the prospect of going home, while another was that many of them are apprehensive about sharing why they were hospitalized. Patients were given the opportunity to discuss these feelings with their peers in preparation for discharge.  Therapeutic Goals  Patient will identify their overall feelings about pending discharge. Patient will think about how they might proactively address issues that they believe will once again arise once they get home (i.e. with parents). Patients will participate in discussion about having hope for change.   Summary of Patient Progress:  Francisco Barrett was very active throughout the session. Patient demonstrated fair insight into the subject matter, and proved open to input from peers and feedback from [place]. Patient was respectful of peers and participated throughout the entire session. Patient states he plans on attending residential treatment for substance use.    Therapeutic Modalities Cognitive Behavioral Therapy   Larose Kells 06/23/2021  3:52 PM

## 2021-06-23 NOTE — Progress Notes (Signed)
Patient alert and oriented x 4, no distress noted, affect is flat but brightens upon approach. Patient is compliant with medication regimen, 15 minutes safety checks maintained.

## 2021-06-23 NOTE — Progress Notes (Signed)
Pt calm and pleasant during assessment. Pt denies SI/HI/AVH. Pt compliant with medication administration per MD orders. Pt given education, support, and encouragement to be active in his treatment plan. Pt being monitored Q 15 minutes for safety per unit protocol. Pt remains safe on the unit.

## 2021-06-23 NOTE — Progress Notes (Signed)
Recreation Therapy Notes  Date: 06/23/2021  Time: 10:15 am    Location: Craft room    Behavioral response: N/A   Intervention Topic: Time Management    Discussion/Intervention: Patient refused to attend group.   Clinical Observations/Feedback:  Patient refused to attend group.    Melika Reder LRT/CTRS        Bobie Caris 06/23/2021 11:26 AM

## 2021-06-23 NOTE — Plan of Care (Signed)
Patient appropriate with staff & peers. Pleasant and cooperative on approach. Patient asked for PRN medication for anxiety.Patient briefly states that just thinking about giving child support for her 43 year old daughter. Denies SI,HI and AVH. Appetite and energy level good. ADLs maintained. Support and encouragement given.

## 2021-06-23 NOTE — Progress Notes (Signed)
[facility] MD Progress Note  06/23/2021 4:31 PM Francisco Barrett  MRN:  [id number] Subjective: Follow-up for this 41 year old man with depression and alcohol and cocaine abuse.  Patient came to me today and told me he was feeling much better.  Mood was improved.  Denied any suicidal thoughts.  He told me that he thought he would be best off being discharged back to the community at large and staying with his sister.  When I reviewed this with the rest of the treatment team it turns out he told the social workers almost exactly the opposite thing just 1/2-hour earlier.  Behavior is fine.  Neatly dressed.  Caring for himself fine.  Does not appear to be dangerous. Principal Problem: MDD (major depressive disorder), recurrent episode, severe (HCC) Diagnosis: Principal Problem:   MDD (major depressive disorder), recurrent episode, severe (HCC) Active Problems:   Alcohol abuse   Cocaine abuse (HCC)   Essential hypertension  Total Time spent with patient: 30 minutes  Past Psychiatric History: Past history of substance abuse and depression  Past Medical History:  Past Medical History:  Diagnosis Date   H/O ETOH abuse    Tobacco abuse    History reviewed. No pertinent surgical history. Family History: History reviewed. No pertinent family history. Family Psychiatric  History: See previous Social History:  Social History   Substance and Sexual Activity  Alcohol Use Yes   Comment: 2 40's     Social History   Substance and Sexual Activity  Drug Use Yes   Types: Marijuana, Cocaine    Social History   Socioeconomic History   Marital status: Single    Spouse name: Not on file   Number of children: Not on file   Years of education: Not on file   Highest education level: Not on file  Occupational History   Not on file  Tobacco Use   Smoking status: Every Day    Packs/day: 0.50    Types: Cigarettes   Smokeless tobacco: Never  Vaping Use   Vaping Use: Never used  Substance and  Sexual Activity   Alcohol use: Yes    Comment: 2 40's   Drug use: Yes    Types: Marijuana, Cocaine   Sexual activity: Not on file  Other Topics Concern   Not on file  Social History Narrative   Not on file   Social Determinants of Health   Financial Resource Strain: Not on file  Food Insecurity: Not on file  Transportation Needs: Not on file  Physical Activity: Not on file  Stress: Not on file  Social Connections: Not on file   Additional Social History:                         Sleep: Fair  Appetite:  Fair  Current Medications: Current Facility-Administered Medications  Medication Dose Route Frequency Provider Last Rate Last Admin   acetaminophen (TYLENOL) tablet 650 mg  650 mg Oral Q6H PRN Gillermo Murdoch, NP       alum & mag hydroxide-simeth (MAALOX/MYLANTA) 200-200-20 MG/5ML suspension 30 mL  30 mL Oral Q4H PRN Gillermo Murdoch, NP       amLODipine (NORVASC) tablet 5 mg  5 mg Oral Daily Verne Lanuza T, MD   5 mg at 06/23/21 0809   folic acid (FOLVITE) tablet 1 mg  1 mg Oral Daily Gillermo Murdoch, NP   1 mg at 06/23/21 0809   hydrOXYzine (ATARAX) tablet 50 mg  50 mg  Oral Q6H PRN Jaevin Medearis T, MD   50 mg at 06/23/21 1219   magnesium hydroxide (MILK OF MAGNESIA) suspension 30 mL  30 mL Oral Daily PRN Gillermo Murdoch, NP       mirtazapine (REMERON) tablet 30 mg  30 mg Oral QHS Yesmin Mutch T, MD   30 mg at 06/22/21 2123   multivitamin with minerals tablet 1 tablet  1 tablet Oral Daily Gillermo Murdoch, NP   1 tablet at 06/23/21 2703   nicotine (NICODERM CQ - dosed in mg/24 hours) patch 21 mg  21 mg Transdermal Daily Cecile Gillispie, Jackquline Denmark, MD   21 mg at 06/23/21 5009    Lab Results: No results found for this or any previous visit (from the past 48 hour(s)).  Blood Alcohol level:  Lab Results  Component Value Date   ETH 175 (H) 06/18/2021   ETH 403 (HH) 12/23/2020    Metabolic Disorder Labs: No results found for: HGBA1C, MPG No results  found for: PROLACTIN No results found for: CHOL, TRIG, HDL, CHOLHDL, VLDL, LDLCALC  Physical Findings: AIMS:  , ,  ,  ,    CIWA:  CIWA-Ar Total: 0 COWS:     Musculoskeletal: Strength & Muscle Tone: within normal limits Gait & Station: normal Patient leans: N/A  Psychiatric Specialty Exam:  Presentation  General Appearance: Appropriate for Environment  Eye Contact:Poor  Speech:Clear and Coherent  Speech Volume:Normal  Handedness:No data recorded  Mood and Affect  Mood:Depressed; Worthless  Affect:Blunt; Congruent   Thought Process  Thought Processes:Coherent  Descriptions of Associations:Intact  Orientation:Full (Time, Place and Person)  Thought Content:WDL  History of Schizophrenia/Schizoaffective disorder:No  Duration of Psychotic Symptoms:No data recorded Hallucinations:No data recorded Ideas of Reference:None  Suicidal Thoughts:No data recorded Homicidal Thoughts:No data recorded  Sensorium  Memory:Immediate Good  Judgment:Fair  Insight:Fair   Executive Functions  Concentration:Fair  Attention Span:Good  Recall:Good  Fund of Knowledge:Fair  Language:Good   Psychomotor Activity  Psychomotor Activity:No data recorded  Assets  Assets:Desire for Improvement   Sleep  Sleep:No data recorded   Physical Exam: Physical Exam Vitals and nursing note reviewed.  Constitutional:      Appearance: Normal appearance.  HENT:     Head: Normocephalic and atraumatic.     Mouth/Throat:     Pharynx: Oropharynx is clear.  Eyes:     Pupils: Pupils are equal, round, and reactive to light.  Cardiovascular:     Rate and Rhythm: Normal rate and regular rhythm.  Pulmonary:     Effort: Pulmonary effort is normal.     Breath sounds: Normal breath sounds.  Abdominal:     General: Abdomen is flat.     Palpations: Abdomen is soft.  Musculoskeletal:        General: Normal range of motion.  Skin:    General: Skin is warm and dry.  Neurological:      General: No focal deficit present.     Mental Status: He is alert. Mental status is at baseline.  Psychiatric:        Mood and Affect: Mood normal.        Thought Content: Thought content normal.   Review of Systems  Constitutional: Negative.   HENT: Negative.    Eyes: Negative.   Respiratory: Negative.    Cardiovascular: Negative.   Gastrointestinal: Negative.   Musculoskeletal: Negative.   Skin: Negative.   Neurological: Negative.   Psychiatric/Behavioral: Negative.    Blood pressure (!) 137/104, pulse 69, temperature 97.9 F (36.6 C),  temperature source Oral, resp. rate 18, height 5\' 3"  (1.6 m), weight 58 kg, SpO2 100 %. Body mass index is 22.65 kg/m.   Treatment Plan Summary: Medication management and Plan medicines reviewed.  Today's blood pressure is up we may need to change blood pressure medicine a little bit.  Patient is giving mixed messages to the treatment team and tomorrow we will review with him what he really thinks might be most helpful for him.  , MD 06/23/2021, 4:31 PM

## 2021-06-24 DIAGNOSIS — F332 Major depressive disorder, recurrent severe without psychotic features: Secondary | ICD-10-CM | POA: Diagnosis not present

## 2021-06-24 MED ORDER — MIRTAZAPINE 30 MG PO TABS: 30.0000 mg | ORAL_TABLET | Freq: Every day | ORAL | 1 refills | Status: DC

## 2021-06-24 MED ORDER — HYDROXYZINE HCL 50 MG PO TABS
50.0000 mg | ORAL_TABLET | Freq: Four times a day (QID) | ORAL | 1 refills | Status: DC | PRN
Start: 2021-06-24 — End: 2021-06-26

## 2021-06-24 MED ORDER — NICOTINE 21 MG/24HR TD PT24: 21.0000 mg | MEDICATED_PATCH | Freq: Every day | TRANSDERMAL | 1 refills | Status: DC

## 2021-06-24 MED ORDER — AMLODIPINE BESYLATE 10 MG PO TABS: 10.0000 mg | ORAL_TABLET | Freq: Every day | ORAL | 1 refills | Status: DC

## 2021-06-24 NOTE — Progress Notes (Signed)
Recreation Therapy Notes  INPATIENT RECREATION TR PLAN  Patient Details Name: Francisco Barrett MRN: [id number] DOB: 18-Dec-1980 Today's Date: 06/24/2021  Rec Therapy Plan Is patient appropriate for Therapeutic Recreation?: Yes Treatment times per week: at least 3 Estimated Length of Stay: 5-7 days TR Treatment/Interventions: Group participation (Comment)  Discharge Criteria Pt will be discharged from therapy if:: Discharged Treatment plan/goals/alternatives discussed and agreed upon by:: Patient/family  Discharge Summary Short term goals set: Patient will identify 3 positive coping skills strategies to use post d/c within 5 recreation therapy group sessions Short term goals met: Adequate for discharge Progress toward goals comments: Groups attended Which groups?: Stress management, Other (Comment) (Relaxation) Reason goals not met: N/A Therapeutic equipment acquired: N/A Reason patient discharged from therapy: Discharge from hospital Pt/family agrees with progress & goals achieved: Yes Date patient discharged from therapy: 06/24/21   Francisco Barrett 06/24/2021, 12:15 PM

## 2021-06-24 NOTE — BHH Suicide Risk Assessment (Signed)
[facility] Discharge Suicide Risk Assessment   Principal Problem: MDD (major depressive disorder), recurrent episode, severe ([place]) Discharge Diagnoses: Principal Problem:   MDD (major depressive disorder), recurrent episode, severe ([place]) Active Problems:   Alcohol abuse   Cocaine abuse ([place])   Essential hypertension   Total Time spent with patient: 30 minutes  Musculoskeletal: Strength & Muscle Tone: within normal limits Gait & Station: normal Patient leans: N/A  Psychiatric Specialty Exam  Presentation  General Appearance: Appropriate for Environment  Eye Contact:Poor  Speech:Clear and Coherent  Speech Volume:Normal  Handedness:No data recorded  Mood and Affect  Mood:Depressed; Worthless  Duration of Depression Symptoms: Greater than two weeks  Affect:Blunt; Congruent   Thought Process  Thought Processes:Coherent  Descriptions of Associations:Intact  Orientation:Full (Time, Place and Person)  Thought Content:WDL  History of Schizophrenia/Schizoaffective disorder:No  Duration of Psychotic Symptoms:No data recorded Hallucinations:No data recorded Ideas of Reference:None  Suicidal Thoughts:No data recorded Homicidal Thoughts:No data recorded  Sensorium  Memory:Immediate Good  Judgment:Fair  Insight:Fair   Executive Functions  Concentration:Fair  Attention Span:Good  [place]  Language:Good   Psychomotor Activity  Psychomotor Activity:No data recorded  Assets  Assets:Desire for Improvement   Sleep  Sleep:No data recorded  Physical Exam: Physical Exam Vitals and nursing note reviewed.  Constitutional:      Appearance: Normal appearance.  HENT:     Head: Normocephalic and atraumatic.     Mouth/Throat:     Pharynx: Oropharynx is clear.  Eyes:     Pupils: Pupils are equal, round, and reactive to light.  Cardiovascular:     Rate and Rhythm: Normal rate and regular rhythm.  Pulmonary:     Effort:  Pulmonary effort is normal.     Breath sounds: Normal breath sounds.  Abdominal:     General: Abdomen is flat.     Palpations: Abdomen is soft.  Musculoskeletal:        General: Normal range of motion.  Skin:    General: Skin is warm and dry.  Neurological:     General: No focal deficit present.     Mental Status: He is alert. Mental status is at baseline.  Psychiatric:        Mood and Affect: Mood normal.        Thought Content: Thought content normal.   Review of Systems  Constitutional: Negative.   HENT: Negative.    Eyes: Negative.   Respiratory: Negative.    Cardiovascular: Negative.   Gastrointestinal: Negative.   Musculoskeletal: Negative.   Skin: Negative.   Neurological: Negative.   Psychiatric/Behavioral: Negative.    Blood pressure (!) 151/97, pulse 83, temperature 97.9 F (36.6 C), temperature source Oral, resp. rate 18, height 5\' 3"  (1.6 m), weight 58 kg, SpO2 100 %. Body mass index is 22.65 kg/m.  Mental Status Per Nursing Assessment::   On Admission:  Suicidal ideation indicated by patient  Demographic Factors:  Male  Loss Factors: Financial problems/change in socioeconomic status  Historical Factors: Impulsivity  Risk Reduction Factors:   Positive social support  Continued Clinical Symptoms:  Depression:   Comorbid alcohol abuse/dependence Alcohol/Substance Abuse/Dependencies  Cognitive Features That Contribute To Risk:  None    Suicide Risk:  Minimal: No identifiable suicidal ideation.  Patients presenting with no risk factors but with morbid ruminations; may be classified as minimal risk based on the severity of the depressive symptoms    Plan Of Care/Follow-up recommendations:  Patient denies all suicidal ideation.  Affect is upbeat and positive.  Thoughts are lucid and clear.  Patient agrees to appropriate outpatient treatment plan  Alethia Berthold, MD 06/24/2021, 11:17 AM

## 2021-06-24 NOTE — Discharge Summary (Signed)
Physician Discharge Summary Note  Patient:  Francisco Barrett is an 41 y.o., male MRN:  [id number] DOB:  03-Mar-1981 Patient phone:  [phone] (home)  Patient address:   [place] [address],  Total Time spent with patient: 30 minutes  Date of Admission:  06/18/2021 Date of Discharge: 06/24/2021  Reason for Admission: Patient was admitted after presenting to the emergency room with complaints of suicidal ideation in the context of ongoing alcohol and cocaine abuse  Principal Problem: MDD (major depressive disorder), recurrent episode, severe ([place]) Discharge Diagnoses: Principal Problem:   MDD (major depressive disorder), recurrent episode, severe ([place]) Active Problems:   Alcohol abuse   Cocaine abuse ([place])   Essential hypertension   Past Psychiatric History: Past history of longstanding substance abuse with periods of sobriety alternating with periods of relapse and chronic depression and mood instability  Past Medical History:  Past Medical History:  Diagnosis Date   H/O ETOH abuse    Tobacco abuse    History reviewed. No pertinent surgical history. Family History: History reviewed. No pertinent family history. Family Psychiatric  History: None reported Social History:  Social History   Substance and Sexual Activity  Alcohol Use Yes   Comment: 2 40's     Social History   Substance and Sexual Activity  Drug Use Yes   Types: Marijuana, Cocaine    Social History   Socioeconomic History   Marital status: Single    Spouse name: Not on file   Number of children: Not on file   Years of education: Not on file   Highest education level: Not on file  Occupational History   Not on file  Tobacco Use   Smoking status: Every Day    Packs/day: 0.50    Types: Cigarettes   Smokeless tobacco: Never  Vaping Use   Vaping Use: Never used  Substance and Sexual Activity   Alcohol use: Yes    Comment: 2 40's   Drug use: Yes    Types: Marijuana,  Cocaine   Sexual activity: Not on file  Other Topics Concern   Not on file  Social History Narrative   Not on file   Social Determinants of Health   Financial Resource Strain: Not on file  Food Insecurity: Not on file  Transportation Needs: Not on file  Physical Activity: Not on file  Stress: Not on file  Social Connections: Not on file    Hospital Course: Admitted to psychiatric unit.  15-minute checks continued.  Patient did not display any dangerous aggressive violent or suicidal behavior.  He was cooperative with treatment and insightful and made good contributions to his treatment plan.  Met more than once with the representative from [facility].  Patient reviewed multiple options about follow-up and has decided to follow-up with local mental health treatment and substance abuse follow-up.  Tolerating medicines well.  At the time of discharge reports his mood is much better and denies all suicidal thought.  Blood pressure was high but has been treated with amlodipine with improvement patient is educated to get medical follow-up for this  Physical Findings: AIMS:  , ,  ,  ,    CIWA:  CIWA-Ar Total: 0 COWS:     Musculoskeletal: Strength & Muscle Tone: within normal limits Gait & Station: normal Patient leans: N/A   Psychiatric Specialty Exam:  Presentation  General Appearance: Appropriate for Environment  Eye Contact:Poor  Speech:Clear and Coherent  Speech Volume:Normal  Handedness:No data recorded  Mood and  Affect  Mood:Depressed; Worthless  Affect:Blunt; Congruent   Thought Process  Thought Processes:Coherent  Descriptions of Associations:Intact  Orientation:Full (Time, Place and Person)  Thought Content:WDL  History of Schizophrenia/Schizoaffective disorder:No  Duration of Psychotic Symptoms:No data recorded Hallucinations:No data recorded Ideas of Reference:None  Suicidal Thoughts:No data recorded Homicidal Thoughts:No data recorded  Sensorium   Memory:Immediate Good  Judgment:Fair  Insight:Fair   Executive Functions  Concentration:Fair  Attention Span:Good  [place]  Language:Good   Psychomotor Activity  Psychomotor Activity:No data recorded  Assets  Assets:Desire for Improvement   Sleep  Sleep:No data recorded   Physical Exam: Physical Exam Vitals and nursing note reviewed.  Constitutional:      Appearance: Normal appearance.  HENT:     Head: Normocephalic and atraumatic.     Mouth/Throat:     Pharynx: Oropharynx is clear.  Eyes:     Pupils: Pupils are equal, round, and reactive to light.  Cardiovascular:     Rate and Rhythm: Normal rate and regular rhythm.  Pulmonary:     Effort: Pulmonary effort is normal.     Breath sounds: Normal breath sounds.  Abdominal:     General: Abdomen is flat.     Palpations: Abdomen is soft.  Musculoskeletal:        General: Normal range of motion.  Skin:    General: Skin is warm and dry.  Neurological:     General: No focal deficit present.     Mental Status: He is alert. Mental status is at baseline.  Psychiatric:        Mood and Affect: Mood normal.        Thought Content: Thought content normal.   Review of Systems  Constitutional: Negative.   HENT: Negative.    Eyes: Negative.   Respiratory: Negative.    Cardiovascular: Negative.   Gastrointestinal: Negative.   Musculoskeletal: Negative.   Skin: Negative.   Neurological: Negative.   Psychiatric/Behavioral: Negative.    Blood pressure (!) 151/97, pulse 83, temperature 97.9 F (36.6 C), temperature source Oral, resp. rate 18, height 5' 3"  (1.6 m), weight 58 kg, SpO2 100 %. Body mass index is 22.65 kg/m.   Social History   Tobacco Use  Smoking Status Every Day   Packs/day: 0.50   Types: Cigarettes  Smokeless Tobacco Never   Tobacco Cessation:  A prescription for an FDA-approved tobacco cessation medication provided at discharge   Blood Alcohol level:  Lab  Results  Component Value Date   ETH 175 (H) 06/18/2021   ETH 403 (HH) 42/35/3614    Metabolic Disorder Labs:  No results found for: HGBA1C, MPG No results found for: PROLACTIN No results found for: CHOL, TRIG, HDL, CHOLHDL, VLDL, LDLCALC  See Psychiatric Specialty Exam and Suicide Risk Assessment completed by Attending Physician prior to discharge.  Discharge destination:  Home  Is patient on multiple antipsychotic therapies at discharge:  No   Has Patient had three or more failed trials of antipsychotic monotherapy by history:  No  Recommended Plan for Multiple Antipsychotic Therapies: NA  Discharge Instructions     Diet - low sodium heart healthy   Complete by: As directed    Increase activity slowly   Complete by: As directed       Allergies as of 06/24/2021   No Known Allergies      Medication List     STOP taking these medications    acetaminophen 325 MG tablet Commonly known as: TYLENOL  TAKE these medications      Indication  amLODipine 10 MG tablet Commonly known as: NORVASC Take 1 tablet (10 mg total) by mouth daily.  Indication: High Blood Pressure Disorder   hydrOXYzine 50 MG tablet Commonly known as: ATARAX Take 1 tablet (50 mg total) by mouth every 6 (six) hours as needed for anxiety.  Indication: Feeling Anxious   mirtazapine 30 MG tablet Commonly known as: REMERON Take 1 tablet (30 mg total) by mouth at bedtime.  Indication: Major Depressive Disorder   nicotine 21 mg/24hr patch Commonly known as: NICODERM CQ - dosed in mg/24 hours Place 1 patch (21 mg total) onto the skin daily.  Indication: Nicotine Addiction        Follow-up Information     [facility] Follow up.   Why: Your appointment is scheduled for 07/02/21 at 2:30pm. Thanks! Contact information: [place] [address] [phone]                 Follow-up recommendations: Follow up with [facility] continuing current  medication  Comments: Prescriptions and supply provided  Signed: Alethia Berthold, MD 06/24/2021, 11:54 AM

## 2021-06-24 NOTE — Plan of Care (Signed)
°  Problem: Coping Skills Goal: STG - Patient will identify 3 positive coping skills strategies to use post d/c within 5 recreation therapy group sessions Description: STG - Patient will identify 3 positive coping skills strategies to use post d/c within 5 recreation therapy group sessions 06/24/2021 1214 by Alveria Apley, LRT Outcome: Adequate for Discharge 06/24/2021 1214 by Alveria Apley, LRT Outcome: Adequate for Discharge

## 2021-06-24 NOTE — Progress Notes (Signed)
Patient alert and oriented this shift. Denies SI/HI/AVH. Denies anxiety and depression.   Calm and cooperative. Interacts appropriately with staff and peers.   Takes medication as prescribed. No adverse reactions to meds. Patient preparing for discharge. Cont Q15 minute check for safety.

## 2021-06-24 NOTE — Progress Notes (Signed)
°  [facility] Adult Case Management Discharge Plan :  Will you be returning to the same living situation after discharge:  Yes,  pt plans to return home. At discharge, do you have transportation home?: Yes,  pt states he has transportation. Do you have the ability to pay for your medications: No.  Release of information consent forms completed and in the chart;  Patient's signature needed at discharge.  Patient to Follow up at:  Follow-up Information     [facility] Follow up.   Why: Your appointment is scheduled for 07/02/21 at 2:30pm. Thanks! Contact information: [address] [place] [address] [phone]                 Next level of care provider has access to [facility]:no  Safety Planning and Suicide Prevention discussed: Yes,  SPE completed with the pt.     Has patient been referred to the Quitline?: Patient refused referral  Patient has been referred for addiction treatment: Yes  Glenis Smoker, LCSW 06/24/2021, 11:33 AM

## 2021-06-24 NOTE — Plan of Care (Signed)
  Problem: Education: Goal: Knowledge of [facility] Education information/materials will improve Outcome: Adequate for Discharge Goal: Emotional status will improve Outcome: Adequate for Discharge Goal: Mental status will improve Outcome: Adequate for Discharge Goal: Verbalization of understanding the information provided will improve Outcome: Adequate for Discharge   Problem: Safety: Goal: Periods of time without injury will increase Outcome: Adequate for Discharge   Problem: Education: Goal: Utilization of techniques to improve thought processes will improve Outcome: Adequate for Discharge Goal: Knowledge of the prescribed therapeutic regimen will improve Outcome: Adequate for Discharge   Problem: Safety: Goal: Ability to disclose and discuss suicidal ideas will improve Outcome: Adequate for Discharge Goal: Ability to identify and utilize support systems that promote safety will improve Outcome: Adequate for Discharge   

## 2021-06-24 NOTE — Progress Notes (Signed)
D: Pt alert and oriented. Pt denies experiencing any pain, SI/HI, or AVH at this time. Pt reports he will be able to keep themselves safe when they return home.   A: Pt received discharge and medication education/information. Pt belongings were returned and signed for at this time to include printed prescriptions and valuables.    R: Pt verbalized understanding of discharge and medication education/information.   Pt escorted by staff to the physicians on call lot  where [facility] will picked pt up and transported to [facility].

## 2021-06-24 NOTE — Progress Notes (Signed)
Recreation Therapy Notes  Date: 06/24/2021  Time: 10:10 am     Location: Craft room    Behavioral response: Appropriate  Intervention Topic:  Stress Management    Discussion/Intervention:  Group content on today was focused on stress. The group defined stress and way to cope with stress. Participants expressed how they know when they are stresses out. Individuals described the different ways they have to cope with stress. The group stated reasons why it is important to cope with stress. Patient explained what good stress is and some examples. The group participated in the intervention Stress Management. Individuals were separated into two group and answered questions related to stress.   Clinical Observations/Feedback: Patient came to group and defined stress as pain. He identified smoking as a way he manages his stress. Participant expressed that time management is important to make the right decision. Individual was social with peers and staff while participating in the intervention.   Mandeep Kiser LRT/CTRS         Jonnie Truxillo 06/24/2021 11:57 AM

## 2021-06-25 ENCOUNTER — Emergency Department
Admission: EM | Admit: 2021-06-25 | Discharge: 2021-06-26 | Disposition: A | Discharge status: Home / Self Care | Payer: Self-pay | Attending: Emergency Medicine | Admitting: Emergency Medicine

## 2021-06-25 ENCOUNTER — Other Ambulatory Visit: Payer: Self-pay

## 2021-06-25 ENCOUNTER — Encounter: Payer: Self-pay | Admitting: *Deleted

## 2021-06-25 DIAGNOSIS — Z5900 Homelessness unspecified: Secondary | ICD-10-CM

## 2021-06-25 DIAGNOSIS — M79605 Pain in left leg: Secondary | ICD-10-CM

## 2021-06-25 DIAGNOSIS — I10 Essential (primary) hypertension: Secondary | ICD-10-CM

## 2021-06-25 DIAGNOSIS — F17200 Nicotine dependence, unspecified, uncomplicated: Secondary | ICD-10-CM | POA: Insufficient documentation

## 2021-06-25 DIAGNOSIS — M79662 Pain in left lower leg: Secondary | ICD-10-CM | POA: Insufficient documentation

## 2021-06-25 LAB — CBC
HCT: 43.1 % (ref 39.0–52.0)
Hemoglobin: 15 g/dL (ref 13.0–17.0)
MCH: 32.3 pg (ref 26.0–34.0)
MCHC: 34.8 g/dL (ref 30.0–36.0)
MCV: 92.9 fL (ref 80.0–100.0)
Platelets: 358 10*3/uL (ref 150–400)
RBC: 4.64 MIL/uL (ref 4.22–5.81)
RDW: 12.2 % (ref 11.5–15.5)
WBC: 17.2 10*3/uL — ABNORMAL HIGH (ref 4.0–10.5)
nRBC: 0 % (ref 0.0–0.2)

## 2021-06-25 LAB — BASIC METABOLIC PANEL
Anion gap: 8 (ref 5–15)
BUN: 15 mg/dL (ref 6–20)
CO2: 28 mmol/L (ref 22–32)
Calcium: 10 mg/dL (ref 8.9–10.3)
Chloride: 98 mmol/L (ref 98–111)
Creatinine, Ser: 0.71 mg/dL (ref 0.61–1.24)
GFR, Estimated: >60 mL/min (ref >60)
Glucose, Bld: 98 mg/dL (ref 70–99)
Potassium: 4.8 mmol/L (ref 3.5–5.1)
Sodium: 134 mmol/L — ABNORMAL LOW (ref 135–145)

## 2021-06-25 MED ORDER — AMLODIPINE BESYLATE 5 MG PO TABS
10.0000 mg | ORAL_TABLET | Freq: Once | ORAL | Status: AC
  Administered 2021-06-26: 10 mg via ORAL
  Filled 2021-06-25: qty 2

## 2021-06-25 MED ORDER — ACETAMINOPHEN 500 MG PO TABS
1000.0000 mg | ORAL_TABLET | Freq: Once | ORAL | Status: AC
  Administered 2021-06-26: 1000 mg via ORAL
  Filled 2021-06-25: qty 2

## 2021-06-25 NOTE — ED Triage Notes (Signed)
Pt reports blood pressure is elevated.  Pt has a rx and did not get it filled.  Pt was seen here in the er.  Pt is homeless.  Pt alert  speech clear.  ?

## 2021-06-26 ENCOUNTER — Emergency Department: Payer: Self-pay

## 2021-06-26 ENCOUNTER — Other Ambulatory Visit: Payer: Self-pay

## 2021-06-26 MED ORDER — AMLODIPINE BESYLATE 5 MG PO TABS
10.0000 mg | ORAL_TABLET | Freq: Every day | ORAL | 1 refills | Status: DC
  Filled 2021-06-26: qty 30, 30d supply, fill #0

## 2021-06-26 MED ORDER — MIRTAZAPINE 30 MG PO TABS
30.0000 mg | ORAL_TABLET | Freq: Every day | ORAL | 1 refills | Status: AC
  Filled 2021-06-26: qty 30, 30d supply, fill #0

## 2021-06-26 MED ORDER — HYDROXYZINE HCL 50 MG PO TABS
50.0000 mg | ORAL_TABLET | Freq: Four times a day (QID) | ORAL | 1 refills | Status: DC | PRN
  Filled 2021-06-26: qty 60, 15d supply, fill #0

## 2021-06-26 NOTE — ED Notes (Signed)
Pt given meal tray and update on POC for SW ?

## 2021-06-26 NOTE — ED Notes (Signed)
Patient provided eye mask per request, NAD noted, patient resting quietly in bed, denies needs at this time. ?

## 2021-06-26 NOTE — TOC Initial Note (Signed)
Transition of Care (TOC) - Initial/Assessment Note  ? ? ?Patient Details  ?Name: Francisco Barrett ?MRN: [id number] ?Date of Birth: 03-Aug-1980 ? ?Transition of Care (TOC) CM/SW Contact:    ?Shelbie Hutching, RN ?Phone Number: ?06/26/2021, 9:12 AM ? ?Clinical Narrative:                 ?Patient came into the emergency room with high blood pressure.  Patient did not get his prescription filled when he was discharged from the [facility].  ER MD sent prescriptions to [facility].  Patient will discharge and he can pick his prescriptions up at his convenience.   ? ?Expected Discharge Plan: [place] ?Barriers to Discharge: Barriers Resolved ? ? ?Patient Goals and CMS Choice ?  ?  ?  ? ?Expected Discharge Plan and Services ?Expected Discharge Plan: [place] ?  ?Discharge Planning Services: CM Consult, Medication Assistance ?  ?Living arrangements for the past 2 months: Homeless ?                ?DME Arranged: N/A ?DME Agency: NA ?  ?  ?  ?HH Arranged: NA ?Orient Agency: NA ?  ?  ?  ? ?Prior Living Arrangements/Services ?Living arrangements for the past 2 months: Homeless ?Lives with:: Self ?Patient language and need for interpreter reviewed:: Yes ?Do you feel safe going back to the place where you live?: Yes      ?Need for Family Participation in Patient Care: No (Comment) ?Care giver support system in place?: No (comment) ?  ?Criminal Activity/Legal Involvement Pertinent to Current Situation/Hospitalization: No - Comment as needed ? ?Activities of Daily Living ?  ?  ? ?Permission Sought/Granted ?  ?  ?   ?   ?   ?   ? ?Emotional Assessment ?  ?  ?  ?Orientation: : Oriented to Self, Oriented to Place, Oriented to  Time, Oriented to Situation ?Alcohol / Substance Use: Alcohol Use, Illicit Drugs ?Psych Involvement: No (comment) ? ?Admission diagnosis:  High BP ?Patient Active Problem List  ? Diagnosis Date Noted  ? Alcohol abuse 06/19/2021  ? Cocaine abuse ([place]) 06/19/2021  ? Essential hypertension  06/19/2021  ? MDD (major depressive disorder) 06/18/2021  ? MDD (major depressive disorder), recurrent episode, severe ([place]) 06/18/2021  ? Suicidal ideation   ? Tobacco abuse   ? Polysubstance abuse ([place])   ? Leukocytosis   ? Acute respiratory failure with hypoxia ([place]) 10/06/2020  ? ?PCP:  Pcp, No ?Pharmacy:   ?[facility] ?[address] ?[place] [address] ?Phone: [phone] Fax: [phone] ? ? ? ? ?Social Determinants of Health (SDOH) Interventions ?  ? ?Readmission Risk Interventions ?No flowsheet data found. ? ? ?

## 2021-06-26 NOTE — ED Provider Notes (Signed)
? ?[facility] ?Provider Note ? ? ? Event Date/Time  ? First MD Initiated Contact with Patient 06/25/21 2337   ?  (approximate) ? ? ?History  ? ?Hypertension ? ? ?HPI ? ?Francisco Barrett is a 41 y.o. male with a history of alcohol abuse, tobacco abuse, cocaine abuse, homelessness who presents for evaluation of left leg pain.  Patient was discharged from behavioral health unit yesterday after being admitted for 1 week for major depressive disorder in the setting of polysubstance abuse.  He was told that he had hypertension and was prescribed amlodipine.  He has been unable to fill the prescription.  He reports that he walked roughly 10 miles since being discharged last night.  Try to go to a shelter this evening but they would not accept him therefore he came to the hospital time to sleep without being noticed in the waiting room and was asked to leave.  At that point he decided to get checked in to ask for help filling his hypertensive medication and to have his left leg evaluated.  He reports that the pain started after walking 10 miles.  He describes this as a diffuse dull achy pain but he also feels like there is a knot in the back of his left leg that has been there for couple of days.  He denies any prior history of PE or DVT.  Last cocaine was last night after being discharged from the hospital ?  ? ? ?Past Medical History:  ?Diagnosis Date  ? H/O ETOH abuse   ? Tobacco abuse   ? ? ?No past surgical history on file. ? ? ?Physical Exam  ? ?Triage Vital Signs: ?ED Triage Vitals  ?Enc Vitals Group  ?   BP 06/25/21 2244 (!) 134/103  ?   Pulse Rate 06/25/21 2244 (!) 105  ?   Resp 06/25/21 2244 20  ?   Temp 06/25/21 2244 98.7 ?F (37.1 ?C)  ?   Temp Source 06/25/21 2244 Oral  ?   SpO2 06/25/21 2244 99 %  ?   Weight 06/25/21 2245 120 lb (54.4 kg)  ?   Height 06/25/21 2245 5\' 3"  (1.6 m)  ?   Head Circumference --   ?   Peak Flow --   ?   Pain Score 06/25/21 2245 7  ?   Pain Loc --   ?    Pain Edu? --   ?   Excl. in GC? --   ? ? ?Most recent vital signs: ?Vitals:  ? 06/26/21 0012 06/26/21 0243  ?BP: (!) 143/100 124/86  ?Pulse: 97 89  ?Resp: 18 17  ?Temp:    ?SpO2: 100% 99%  ? ? ? ?Constitutional: Alert and oriented. Well appearing and in no apparent distress. ?HEENT: ?     Head: Normocephalic and atraumatic.    ?     Eyes: Conjunctivae are normal. Sclera is non-icteric.  ?     Mouth/Throat: Mucous membranes are moist.  ?     Neck: Supple with no signs of meningismus. ?Cardiovascular: Regular rate and rhythm. No murmurs, gallops, or rubs. 2+ symmetrical distal pulses are present in all extremities.  ?Respiratory: Normal respiratory effort. Lungs are clear to auscultation bilaterally.  ?Gastrointestinal: Soft, non tender, and non distended with positive bowel sounds. No rebound or guarding. ?Genitourinary: No CVA tenderness. ?Musculoskeletal:  No edema, cyanosis, or erythema of extremities. ?Neurologic: Normal speech and language. Face is symmetric. Moving all extremities. No gross focal neurologic  deficits are appreciated. ?Skin: Skin is warm, dry and intact. No rash noted. ?Psychiatric: Mood and affect are normal. Speech and behavior are normal. ? ?ED Results / Procedures / Treatments  ? ?Labs ?(all labs ordered are listed, but only abnormal results are displayed) ?Labs Reviewed  ?BASIC METABOLIC PANEL - Abnormal; Notable for the following components:  ?    Result Value  ? Sodium 134 (*)   ? All other components within normal limits  ?CBC - Abnormal; Notable for the following components:  ? WBC 17.2 (*)   ? All other components within normal limits  ? ? ? ?EKG ? ?none ? ? ?RADIOLOGY ?I, Nita Sickle, attending MD, have personally viewed and interpreted the images obtained during this visit as below: ? ?Ultrasound negative for DVT ? ? ?___________________________________________________ ?Interpretation by Radiologist:  ?US Venous Img Lower Unilateral Left ? ?Result Date: 06/26/2021 ?CLINICAL DATA:   Left leg pain EXAM: LEFT LOWER EXTREMITY VENOUS DOPPLER ULTRASOUND TECHNIQUE: Gray-scale sonography with compression, as well as color and duplex ultrasound, were performed to evaluate the deep venous system(s) from the level of the common femoral vein through the popliteal and proximal calf veins. COMPARISON:  None. FINDINGS: VENOUS Normal compressibility of the common femoral, superficial femoral, and popliteal veins, as well as the visualized calf veins. Visualized portions of profunda femoral vein and great saphenous vein unremarkable. No filling defects to suggest DVT on grayscale or color Doppler imaging. Doppler waveforms show normal direction of venous flow, normal respiratory plasticity and response to augmentation. Limited views of the contralateral common femoral vein are unremarkable. OTHER None. Limitations: none IMPRESSION: Negative. Electronically Signed   By: Helyn Numbers M.D.   On: 06/26/2021 01:04   ? ? ? ?PROCEDURES: ? ?Critical Care performed: No ? ?Procedures ? ? ? ?IMPRESSION / MDM / ASSESSMENT AND PLAN / ED COURSE  ?I reviewed the triage vital signs and the nursing notes. ? ?41 y.o. male with a history of alcohol abuse, tobacco abuse, cocaine abuse, homelessness who presents for evaluation of left leg pain.  Patient is complaining of diffuse soreness of his leg after walking 10 miles but also feels like there is a knot that is painful in the back of his left leg.  On exam left leg is warm and well-perfused with strong distal pulses and brisk capillary refill.  Full painless range of motion of all joints with no erythema or signs of cellulitis.  No asymmetric swelling ? ?Ddx: Cramps versus muscle soreness versus DVT.  No signs of cellulitis, no signs of ischemic leg ? ? ?Plan: We will get an ultrasound venous Doppler and consult social work in the morning to help obtain his prescriptions.  Basic labs were obtained including a BMP and a CBC. ? ? ?MEDICATIONS GIVEN IN ED: ?Medications   ?amLODipine (NORVASC) tablet 10 mg (10 mg Oral Given 06/26/21 0008)  ?acetaminophen (TYLENOL) tablet 1,000 mg (1,000 mg Oral Given 06/26/21 0008)  ? ? ? ?ED COURSE: Ultrasound negative for DVT.  Patient spent the night in ED with no further complaints.  To see the social work in the morning to help get his prescriptions filled.  Anticipate discharge after then ? ? ?Consults: None ? ? ?EMR reviewed including patient's recent admission to behavioral health ? ? ? ?FINAL CLINICAL IMPRESSION(S) / ED DIAGNOSES  ? ?Final diagnoses:  ?Pain of left lower extremity  ?Homelessness  ?Primary hypertension  ? ? ? ?Rx / DC Orders  ? ?ED Discharge Orders   ? ?  None  ? ?  ? ? ? ?Note:  This document was prepared using Dragon voice recognition software and may include unintentional dictation errors. ? ? ?Please note:  Patient was evaluated in Emergency Department today for the symptoms described in the history of present illness. Patient was evaluated in the context of the global COVID-19 pandemic, which necessitated consideration that the patient might be at risk for infection with the SARS-CoV-2 virus that causes COVID-19. Institutional protocols and algorithms that pertain to the evaluation of patients at risk for COVID-19 are in a state of rapid change based on information released by regulatory bodies including the CDC and federal and state organizations. These policies and algorithms were followed during the patient's care in the ED.  Some ED evaluations and interventions may be delayed as a result of limited staffing during the pandemic. ? ? ? ? ?  ?Nita Sickle, MD ?06/26/21 [id number] ? ?

## 2021-06-27 ENCOUNTER — Emergency Department
Admission: EM | Admit: 2021-06-27 | Discharge: 2021-06-27 | Disposition: A | Discharge status: Home / Self Care | Payer: Self-pay | Attending: Emergency Medicine | Admitting: Emergency Medicine

## 2021-06-27 ENCOUNTER — Encounter: Payer: Self-pay | Admitting: Emergency Medicine

## 2021-06-27 ENCOUNTER — Other Ambulatory Visit: Payer: Self-pay

## 2021-06-27 DIAGNOSIS — Z87891 Personal history of nicotine dependence: Secondary | ICD-10-CM | POA: Insufficient documentation

## 2021-06-27 DIAGNOSIS — M79605 Pain in left leg: Secondary | ICD-10-CM | POA: Insufficient documentation

## 2021-06-27 MED ORDER — ACETAMINOPHEN 500 MG PO TABS
1000.0000 mg | ORAL_TABLET | Freq: Once | ORAL | Status: AC
  Administered 2021-06-27: 1000 mg via ORAL
  Filled 2021-06-27: qty 2

## 2021-06-27 NOTE — ED Notes (Addendum)
Pt yelling, cursing, upset over his treatment; pt given meds as ordered and d/c instructions; pt throws d/c papers in trash and exits triage; security out to lobby to escort pt out ?

## 2021-06-27 NOTE — ED Notes (Signed)
MD to triage to evaluate pt ?

## 2021-06-27 NOTE — ED Provider Notes (Signed)
? ?[facility] ?Provider Note ? ? ? Event Date/Time  ? First MD Initiated Contact with Patient 06/27/21 0539   ?  (approximate) ? ? ?History  ? ?Leg Pain ? ? ?HPI ? ?Francisco Barrett is a 41 y.o. male  with a history of alcohol abuse, tobacco abuse, cocaine abuse, homelessness who presents for evaluation of left leg pain.  Patient again was trying to spend the night in the waiting room.  Was confronted by nursing staff that he was not allowed to do that.  He then decided to get checked back in for left leg pain.  Patient reports 2 days now of left calf crampy pain that started after he had to walk roughly 10 miles to the hospital.  Patient was seen yesterday for the same exact complaint and had a ultrasound venous Doppler which was negative.  He was told that he had just muscle cramps and treated with Tylenol.  Patient reports that he is homeless and he cannot afford Tylenol.  No chest pain or shortness of breath. ?  ? ? ?Past Medical History:  ?Diagnosis Date  ? H/O ETOH abuse   ? Tobacco abuse   ? ? ?History reviewed. No pertinent surgical history. ? ? ?Physical Exam  ? ?Triage Vital Signs: ?ED Triage Vitals  ?Enc Vitals Group  ?   BP 06/27/21 0442 (!) 137/100  ?   Pulse Rate 06/27/21 0442 81  ?   Resp 06/27/21 0442 16  ?   Temp 06/27/21 0442 98.3 ?F (36.8 ?C)  ?   Temp Source 06/27/21 0442 Oral  ?   SpO2 06/27/21 0442 100 %  ?   Weight 06/27/21 0440 119 lb 14.9 oz (54.4 kg)  ?   Height 06/27/21 0440 5\' 3"  (1.6 m)  ?   Head Circumference --   ?   Peak Flow --   ?   Pain Score 06/27/21 0440 10  ?   Pain Loc --   ?   Pain Edu? --   ?   Excl. in GC? --   ? ? ?Most recent vital signs: ?Vitals:  ? 06/27/21 0442  ?BP: (!) 137/100  ?Pulse: 81  ?Resp: 16  ?Temp: 98.3 ?F (36.8 ?C)  ?SpO2: 100%  ? ? ? ?Constitutional: Alert and oriented. Well appearing and in no apparent distress. ?HEENT: ?     Head: Normocephalic and atraumatic.    ?     Eyes: Conjunctivae are normal. Sclera is non-icteric.   ?     Mouth/Throat: Mucous membranes are moist.  ?     Neck: Supple with no signs of meningismus. ?Cardiovascular: Regular rate and rhythm. No murmurs, gallops, or rubs. 2+ symmetrical distal pulses are present in all extremities.  ?Respiratory: Normal respiratory effort. Lungs are clear to auscultation bilaterally.  ?Gastrointestinal: Soft, non tender. ?Musculoskeletal:  No edema, cyanosis, or erythema of extremities. ?Neurologic: Normal speech and language. Face is symmetric. Moving all extremities. No gross focal neurologic deficits are appreciated. ?Skin: Skin is warm, dry and intact. No rash noted. ?Psychiatric: Mood and affect are normal. Speech and behavior are normal. ? ?ED Results / Procedures / Treatments  ? ?Labs ?(all labs ordered are listed, but only abnormal results are displayed) ?Labs Reviewed - No data to display ? ? ?EKG ? ?none ? ? ?RADIOLOGY ?none ? ? ?PROCEDURES: ? ?Critical Care performed: No ? ?Procedures ? ? ? ?IMPRESSION / MDM / ASSESSMENT AND PLAN / ED COURSE  ?I reviewed  the triage vital signs and the nursing notes. ? ?41 y.o. male  with a history of alcohol abuse, tobacco abuse, cocaine abuse, homelessness who presents for evaluation of left leg pain.  Second visit in 24 hours for left leg pain.  Patient keeps coming to our waiting room trying to spend the night since he is homeless and when confronted by security he decided to get checked in.  Last night we allowed him to spend the whole night in the ER but we explained to him that we cannot do it at every night.  He needs to go to a shelter during the day.  The shelters will not take him because he uses cocaine.  He has had the same exam as yesterday with normal perfusion of both extremities, no signs of cellulitis, no signs of ischemic leg, no signs of asymmetric swelling, full painless range of motion of the joints.  He had a Doppler ultrasound done yesterday which was negative for DVT.  Patient tells me that he is unable to afford  Tylenol.  I gave him a dose of Tylenol.  We did have a discussion about the importance of purchasing his medications and that the money that he uses for cocaine should be used for these medications instead.  I discussed my standard return precautions with him.  No indication for further imaging or labs today ? ?MEDICATIONS GIVEN IN ED: ?Medications  ?acetaminophen (TYLENOL) tablet 1,000 mg (has no administration in time range)  ? ? ? ?FINAL CLINICAL IMPRESSION(S) / ED DIAGNOSES  ? ?Final diagnoses:  ?Left leg pain  ? ? ? ?Rx / DC Orders  ? ?ED Discharge Orders   ? ? None  ? ?  ? ? ? ?Note:  This document was prepared using Dragon voice recognition software and may include unintentional dictation errors. ? ? ?Please note:  Patient was evaluated in Emergency Department today for the symptoms described in the history of present illness. Patient was evaluated in the context of the global COVID-19 pandemic, which necessitated consideration that the patient might be at risk for infection with the SARS-CoV-2 virus that causes COVID-19. Institutional protocols and algorithms that pertain to the evaluation of patients at risk for COVID-19 are in a state of rapid change based on information released by regulatory bodies including the CDC and federal and state organizations. These policies and algorithms were followed during the patient's care in the ED.  Some ED evaluations and interventions may be delayed as a result of limited staffing during the pandemic. ? ? ? ? ?  ?Nita Sickle, MD ?06/27/21 [phone] ? ?

## 2021-06-27 NOTE — ED Triage Notes (Signed)
Pt found sleeping in lobby; pt is homeless and when told due to restrictions he is not allowed in the lobby unless he is a patient or accomp a pt he st he would like to check in to be seen for his leg; st "they keep telling me nothing is wrong with it but it still hurts"; pt was eval for same yesterday after same circumstances ?

## 2021-06-30 ENCOUNTER — Other Ambulatory Visit: Payer: Self-pay

## 2021-07-05 ENCOUNTER — Emergency Department
Admission: EM | Admit: 2021-07-05 | Discharge: 2021-07-06 | Disposition: A | Discharge status: Home / Self Care | Payer: 59 | Attending: Emergency Medicine | Admitting: Emergency Medicine

## 2021-07-05 ENCOUNTER — Other Ambulatory Visit: Payer: Self-pay

## 2021-07-05 DIAGNOSIS — F332 Major depressive disorder, recurrent severe without psychotic features: Secondary | ICD-10-CM | POA: Diagnosis present

## 2021-07-05 DIAGNOSIS — F1410 Cocaine abuse, uncomplicated: Secondary | ICD-10-CM | POA: Insufficient documentation

## 2021-07-05 DIAGNOSIS — F1094 Alcohol use, unspecified with alcohol-induced mood disorder: Secondary | ICD-10-CM | POA: Insufficient documentation

## 2021-07-05 DIAGNOSIS — F1019 Alcohol abuse with unspecified alcohol-induced disorder: Secondary | ICD-10-CM | POA: Insufficient documentation

## 2021-07-05 DIAGNOSIS — F339 Major depressive disorder, recurrent, unspecified: Secondary | ICD-10-CM | POA: Insufficient documentation

## 2021-07-05 DIAGNOSIS — F17210 Nicotine dependence, cigarettes, uncomplicated: Secondary | ICD-10-CM | POA: Insufficient documentation

## 2021-07-05 DIAGNOSIS — F1910 Other psychoactive substance abuse, uncomplicated: Secondary | ICD-10-CM | POA: Diagnosis present

## 2021-07-05 DIAGNOSIS — R45851 Suicidal ideations: Secondary | ICD-10-CM | POA: Diagnosis not present

## 2021-07-05 DIAGNOSIS — F1010 Alcohol abuse, uncomplicated: Secondary | ICD-10-CM

## 2021-07-05 DIAGNOSIS — Y907 Blood alcohol level of 200-239 mg/100 ml: Secondary | ICD-10-CM | POA: Insufficient documentation

## 2021-07-05 DIAGNOSIS — Z20822 Contact with and (suspected) exposure to covid-19: Secondary | ICD-10-CM | POA: Insufficient documentation

## 2021-07-05 LAB — COMPREHENSIVE METABOLIC PANEL
ALT: 28 U/L (ref 0–44)
AST: 20 U/L (ref 15–41)
Albumin: 3.9 g/dL (ref 3.5–5.0)
Alkaline Phosphatase: 60 U/L (ref 38–126)
Anion gap: 8 (ref 5–15)
BUN: 12 mg/dL (ref 6–20)
CO2: 27 mmol/L (ref 22–32)
Calcium: 9.1 mg/dL (ref 8.9–10.3)
Chloride: 108 mmol/L (ref 98–111)
Creatinine, Ser: 0.77 mg/dL (ref 0.61–1.24)
GFR, Estimated: >60 mL/min (ref >60)
Glucose, Bld: 104 mg/dL — ABNORMAL HIGH (ref 70–99)
Potassium: 3.6 mmol/L (ref 3.5–5.1)
Sodium: 143 mmol/L (ref 135–145)
Total Bilirubin: 0.3 mg/dL (ref 0.3–1.2)
Total Protein: 8 g/dL (ref 6.5–8.1)

## 2021-07-05 LAB — CBC WITH DIFFERENTIAL/PLATELET
Abs Immature Granulocytes: 0.04 10*3/uL (ref 0.00–0.07)
Basophils Absolute: 0 10*3/uL (ref 0.0–0.1)
Basophils Relative: 1 %
Eosinophils Absolute: 0.1 10*3/uL (ref 0.0–0.5)
Eosinophils Relative: 1 %
HCT: 41.5 % (ref 39.0–52.0)
Hemoglobin: 14.1 g/dL (ref 13.0–17.0)
Immature Granulocytes: 1 %
Lymphocytes Relative: 36 %
Lymphs Abs: 2.1 10*3/uL (ref 0.7–4.0)
MCH: 31.8 pg (ref 26.0–34.0)
MCHC: 34 g/dL (ref 30.0–36.0)
MCV: 93.7 fL (ref 80.0–100.0)
Monocytes Absolute: 0.3 10*3/uL (ref 0.1–1.0)
Monocytes Relative: 5 %
Neutro Abs: 3.2 10*3/uL (ref 1.7–7.7)
Neutrophils Relative %: 56 %
Platelets: 396 10*3/uL (ref 150–400)
RBC: 4.43 MIL/uL (ref 4.22–5.81)
RDW: 11.8 % (ref 11.5–15.5)
WBC: 5.7 10*3/uL (ref 4.0–10.5)
nRBC: 0 % (ref 0.0–0.2)

## 2021-07-05 LAB — SALICYLATE LEVEL: Salicylate Lvl: <7 mg/dL — ABNORMAL LOW (ref 7.0–30.0)

## 2021-07-05 LAB — URINE DRUG SCREEN, QUALITATIVE (ARMC ONLY)
Amphetamines, Ur Screen: NOT DETECTED
Barbiturates, Ur Screen: NOT DETECTED
Benzodiazepine, Ur Scrn: NOT DETECTED
Cannabinoid 50 Ng, Ur ~~LOC~~: POSITIVE — AB
Cocaine Metabolite,Ur ~~LOC~~: POSITIVE — AB
MDMA (Ecstasy)Ur Screen: NOT DETECTED
Methadone Scn, Ur: NOT DETECTED
Opiate, Ur Screen: NOT DETECTED
Phencyclidine (PCP) Ur S: NOT DETECTED
Tricyclic, Ur Screen: NOT DETECTED

## 2021-07-05 LAB — ETHANOL: Alcohol, Ethyl (B): 226 mg/dL — ABNORMAL HIGH (ref <10)

## 2021-07-05 LAB — RESP PANEL BY RT-PCR (FLU A&B, COVID) ARPGX2
Influenza A by PCR: NEGATIVE
Influenza B by PCR: NEGATIVE
SARS Coronavirus 2 by RT PCR: NEGATIVE

## 2021-07-05 LAB — ACETAMINOPHEN LEVEL: Acetaminophen (Tylenol), Serum: <10 ug/mL — ABNORMAL LOW (ref 10–30)

## 2021-07-05 MED ORDER — MIRTAZAPINE 15 MG PO TABS
30.0000 mg | ORAL_TABLET | Freq: Every day | ORAL | Status: DC
Start: 2021-07-05 — End: 2021-07-06
  Administered 2021-07-05: 30 mg via ORAL
  Filled 2021-07-05: qty 2

## 2021-07-05 MED ORDER — THIAMINE HCL 100 MG/ML IJ SOLN
100.0000 mg | Freq: Every day | INTRAMUSCULAR | Status: DC
  Filled 2021-07-05 (×2): qty 1

## 2021-07-05 MED ORDER — DIPHENHYDRAMINE HCL 50 MG/ML IJ SOLN
INTRAMUSCULAR | Status: AC
  Filled 2021-07-05: qty 1

## 2021-07-05 MED ORDER — HALOPERIDOL LACTATE 5 MG/ML IJ SOLN
INTRAMUSCULAR | Status: AC
  Filled 2021-07-05: qty 1

## 2021-07-05 MED ORDER — LORAZEPAM 1 MG PO TABS: 1.0000 mg | ORAL_TABLET | ORAL | Status: DC | PRN

## 2021-07-05 MED ORDER — FOLIC ACID 1 MG PO TABS
1.0000 mg | ORAL_TABLET | Freq: Every day | ORAL | Status: DC
  Administered 2021-07-05 – 2021-07-06 (×2): 1 mg via ORAL
  Filled 2021-07-05 (×2): qty 1

## 2021-07-05 MED ORDER — LORAZEPAM 2 MG/ML IJ SOLN
INTRAMUSCULAR | Status: AC
  Filled 2021-07-05: qty 1

## 2021-07-05 MED ORDER — NICOTINE 21 MG/24HR TD PT24
21.0000 mg | MEDICATED_PATCH | Freq: Every day | TRANSDERMAL | Status: DC
  Administered 2021-07-05 – 2021-07-06 (×2): 21 mg via TRANSDERMAL
  Filled 2021-07-05 (×2): qty 1

## 2021-07-05 MED ORDER — ADULT MULTIVITAMIN W/MINERALS CH
1.0000 | ORAL_TABLET | Freq: Every day | ORAL | Status: DC
  Administered 2021-07-05 – 2021-07-06 (×2): 1 via ORAL
  Filled 2021-07-05 (×2): qty 1

## 2021-07-05 MED ORDER — AMLODIPINE BESYLATE 5 MG PO TABS
10.0000 mg | ORAL_TABLET | Freq: Every day | ORAL | Status: DC
  Administered 2021-07-05 – 2021-07-06 (×2): 10 mg via ORAL
  Filled 2021-07-05 (×2): qty 2

## 2021-07-05 MED ORDER — LORAZEPAM 2 MG/ML IJ SOLN: 1.0000 mg | INTRAMUSCULAR | Status: DC | PRN

## 2021-07-05 MED ORDER — THIAMINE HCL 100 MG PO TABS
100.0000 mg | ORAL_TABLET | Freq: Every day | ORAL | Status: DC
  Administered 2021-07-05 – 2021-07-06 (×2): 100 mg via ORAL
  Filled 2021-07-05 (×2): qty 1

## 2021-07-05 NOTE — BH Assessment (Addendum)
Comprehensive Clinical Assessment (CCA) Note  07/05/2021 Francisco Barrett [id number] Recommendations for Services/Supports/Treatments: Consulted with Lodema Pilot, D., NP, who determined pt. meets inpatient psychiatric criteria. Notified Dr. [name] and Francisco Alanis, RN of disposition recommendation.  Francisco Barrett is a 41 year old, English speaking, black male with a history of MDD and polysubstance abuse. Pt began the assessment with All I wanted to do was get high; they wouldn't let me. They stole my crack. Pt was tearful and despondent throughout the interview. Pt's speech was slurred as pt was under the influence; however, pt's thought processes were relevant and intact. Pt avoided making eye contact. Pt's mood was depressed; affect was congruent with mood, negative, and pessimistic. Patient was noted to have fair insight, evidenced by him acknowledging how his substance abuse and associated depression impacts his ability to properly function. Pt expressed a desire to stop using drugs. Pt reported that he drinks and uses crack daily; last use within the last 24 hours. BAL is 226/UDS has yet to result at the time of assessment. Pt had impaired judgment and was preoccupied with being tired of living. Pt endorsed thoughts of SI with a plan to use drugs to commit the act. Pt stated, I'm going through a lot and I'm tired. Pt reported that he is homeless and not connected to any services. Pt reported attempts to run in traffic to commit suicide in the past. Pt identified grief emotions from losing a long-term relationship of 15 years and insecure housing as his main stressors. Pt was also expansive about his lack of natural supports, explaining that no one loves him. The patient denied HI or AV/H.   Chief Complaint:  Chief Complaint  Patient presents with   Suicidal   Psychiatric Evaluation   Drug / Alcohol Assessment   Visit Diagnosis: MDD recurrent episode, severe Active Problems  Alcohol  abuse   Cocaine abuse (HCC)  CCA Screening, Triage and Referral (STR)  Patient Reported Information How did you hear about [place]? Other (Comment) Mudlogger)  Referral name: No data recorded Referral phone number: No data recorded  Whom do you see for routine medical problems? No data recorded Practice/Facility Name: No data recorded Practice/Facility Phone Number: No data recorded Name of Contact: No data recorded Contact Number: No data recorded Contact Fax Number: No data recorded Prescriber Name: No data recorded Prescriber Address (if known): No data recorded  What Is the Reason for Your Visit/Call Today? Pt arrives with [employer] officer x2, pt appears under the influence and states he wants "to die".  How Long Has This Been Causing You Problems? > than 6 months  What Do You Feel Would Help You the Most Today? Housing Assistance; Stress Management; Social Support; Alcohol or Drug Use Treatment; Treatment for Depression or other mood problem   Have You Recently Been in Any Inpatient Treatment (Hospital/Detox/Crisis Center/28-Day Program)? No data recorded Name/Location of Program/Hospital:No data recorded How Long Were You There? No data recorded When Were You Discharged? No data recorded  Have You Ever Received Services From [facility] Before? No data recorded Who Do You See at [facility]? No data recorded  Have You Recently Had Any Thoughts About Hurting Yourself? Yes  Are You Planning to Commit Suicide/Harm Yourself At This time? Yes   Have you Recently Had Thoughts About Hurting Someone Francisco Barrett? No  Explanation: No data recorded  Have You Used Any Alcohol or Drugs in the Past 24 Hours? Yes  How Long Ago Did You Use Drugs or Alcohol?  No data recorded What Did You Use and How Much? Alcohol, cocaine, and marijuana   Do You Currently Have a Therapist/Psychiatrist? No  Name of Therapist/Psychiatrist: No data recorded  Have You Been Recently Discharged From Any  Office Practice or Programs? No  Explanation of Discharge From Practice/Program: No data recorded    CCA Screening Triage Referral Assessment Type of Contact: Face-to-Face  Is this Initial or Reassessment? No data recorded Date Telepsych consult ordered in [facility]:  No data recorded Time Telepsych consult ordered in [facility]:  No data recorded  Patient Reported Information Reviewed? No data recorded Patient Left Without Being Seen? No data recorded Reason for Not Completing Assessment: No data recorded  Collateral Involvement: None provided   Does Patient Have a Court Appointed Legal Guardian? No data recorded Name and Contact of Legal Guardian: No data recorded If Minor and Not Living with Parent(s), Who has Custody? n/a  Is CPS involved or ever been involved? Never  Is APS involved or ever been involved? Never   Patient Determined To Be At Risk for Harm To Self or Others Based on Review of Patient Reported Information or Presenting Complaint? Yes, for Self-Harm  Method: No data recorded Availability of Means: No data recorded Intent: No data recorded Notification Required: No data recorded Additional Information for Danger to Others Potential: No data recorded Additional Comments for Danger to Others Potential: No data recorded Are There Guns or Other Weapons in Your Home? No data recorded Types of Guns/Weapons: No data recorded Are These Weapons Safely Secured?                            No data recorded Who Could Verify You Are Able To Have These Secured: No data recorded Do You Have any Outstanding Charges, Pending Court Dates, Parole/Probation? No data recorded Contacted To Inform of Risk of Harm To Self or Others: No data recorded  Location of Assessment: [facility] ED   Does Patient Present under Involuntary Commitment? Yes  IVC Papers Initial File Date: 07/05/21   [place] of Residence: [place]   Patient Currently Receiving the Following Services: Not Receiving  Services   Determination of Need: Emergent (2 hours)   Options For Referral: Inpatient Hospitalization     CCA Biopsychosocial Intake/Chief Complaint:  No data recorded Current Symptoms/Problems: No data recorded  Patient Reported Schizophrenia/Schizoaffective Diagnosis in Past: No   Strengths: Pt is able to ask for help.  Preferences: No data recorded Abilities: No data recorded  Type of Services Patient Feels are Needed: No data recorded  Initial Clinical Notes/Concerns: No data recorded  Mental Health Symptoms Depression:   Change in energy/activity; Hopelessness; Sleep (too much or little); Tearfulness; Worthlessness; Difficulty Concentrating   Duration of Depressive symptoms:  Greater than two weeks   Mania:   None   Anxiety:    Difficulty concentrating; Sleep; Worrying; Tension   Psychosis:   None   Duration of Psychotic symptoms: No data recorded  Trauma:   N/A   Obsessions:   Cause anxiety; Disrupts routine/functioning; Recurrent & persistent thoughts/impulses/images; Good insight   Compulsions:   Disrupts with routine/functioning; "Driven" to perform behaviors/acts; Good insight; Intended to reduce stress or prevent another outcome; Intrusive/time consuming; Repeated behaviors/mental acts   Inattention:   None   Hyperactivity/Impulsivity:   None   Oppositional/Defiant Behaviors:   N/A   Emotional Irregularity:   Recurrent suicidal behaviors/gestures/threats; Chronic feelings of emptiness; Potentially harmful impulsivity   Other Mood/Personality  Symptoms:  No data recorded   Mental Status Exam Appearance and self-care  Stature:   Small   Weight:   Average weight   Clothing:   -- (In scrubs)   Grooming:   Neglected   Cosmetic use:   None   Posture/gait:   Slumped   Motor activity:   Not Remarkable   Sensorium  Attention:   Normal   Concentration:   Anxiety interferes   Orientation:   Situation; Place; Person;  Object   Recall/memory:   Normal   Affect and Mood  Affect:   Tearful; Negative; Congruent   Mood:   Depressed; Hopeless; Worthless; Anxious   Relating  Eye contact:   Avoided   Facial expression:   Depressed; Sad; Anxious   Attitude toward examiner:   Cooperative   Thought and Language  Speech flow:  Slurred   Thought content:   Appropriate to Mood and Circumstances   Preoccupation:   Suicide   Hallucinations:   None   Organization:  No data recorded  [employer] of Knowledge:   Average   Intelligence:   Average   Abstraction:   Normal   Judgement:   Impaired   Reality Testing:   Adequate   Insight:   Fair   Decision Making:   Impulsive   Social Functioning  Social Maturity:   Isolates; Impulsive   Social Judgement:   "Street Smart"   Stress  Stressors:   Grief/losses; Relationship; Transitions; Housing; Surveyor, quantity; Work   Coping Ability:   Contractor Deficits:   Building services engineer   Supports:   Support needed     Religion: Religion/Spirituality Are You A Religious Person?:  (Not assessed) How Might This Affect Treatment?: n/a  Leisure/Recreation: Leisure / Recreation Do You Have Hobbies?: No  Exercise/Diet: Exercise/Diet Do You Exercise?: No Have You Gained or Lost A Significant Amount of Weight in the Past Six Months?: No Do You Follow a Special Diet?: No Do You Have Any Trouble Sleeping?: Yes Explanation of Sleeping Difficulties: Patient reports he has not been able to sleep.   CCA Employment/Education Employment/Work Situation: Employment / Work Situation Employment Situation: Unemployed Patient's Job has Been Impacted by Current Illness: No Has Patient ever Been in Equities trader?: No  Education: Education Is Patient Currently Attending School?: No Did Theme park manager?: No Did You Have An Individualized Education Program (IIEP): No Did You Have Any Difficulty At  [employer]?: No Patient's Education Has Been Impacted by Current Illness: No   CCA Family/Childhood History Family and Relationship History: Family history Marital status: Single Does patient have children?: Yes How many children?: 3 How is patient's relationship with their children?: Pt reported having a strained relationship with his children. Reported, "There scared of me too, because I drink".  Childhood History:  Childhood History By whom was/is the patient raised?: Mother/father and step-parent Description of patient's current relationship with siblings: "pretty good for the most part, me an my oldest sister get into it " Did patient suffer any verbal/emotional/physical/sexual abuse as a child?: No Did patient suffer from severe childhood neglect?: No Has patient ever been sexually abused/assaulted/raped as an adolescent or adult?: No Was the patient ever a victim of a crime or a disaster?: No Witnessed domestic violence?: No Has patient been affected by domestic violence as an adult?: No  Child/Adolescent Assessment:     CCA Substance Use Alcohol/Drug Use: Alcohol / Drug Use Pain Medications: See MAR Prescriptions: See MAR Over the  Counter: See MAR History of alcohol / drug use?: Yes Longest period of sobriety (when/how long): Unknown Negative Consequences of Use: Financial, Personal relationships Withdrawal Symptoms: None Substance #1 Name of Substance 1: Alcohol 1 - Age of First Use: UTA 1 - Amount (size/oz): Unknown, Pt states, "I don't know; I get fucked up" 1 - Frequency: Daily 1 - Duration: Unknown 1 - Last Use / Amount: 07/04/21 Substance #2 Name of Substance 2: Cocaine "Crack" 2 - Age of First Use: Unknown 2 - Amount (size/oz): Unknown 2 - Frequency: Daily 2 - Duration: Unknown 2 - Last Use / Amount: Pt states "This morning" (07/04/21) 2 - Route of Substance Use: Smoked orally                     ASAM's:  Six Dimensions of Multidimensional  Assessment  Dimension 1:  Acute Intoxication and/or Withdrawal Potential:   Dimension 1:  Description of individual's past and current experiences of substance use and withdrawal: Pt has a hx of polysubstance abuse  Dimension 2:  Biomedical Conditions and Complications:      Dimension 3:  Emotional, Behavioral, or Cognitive Conditions and Complications:  Dimension 3:  Description of emotional, behavioral, or cognitive conditions and complications: Pt is chronically depressed and is expressing thoughts of SI  Dimension 4:  Readiness to Change:     Dimension 5:  Relapse, Continued use, or Continued Problem Potential:     Dimension 6:  Recovery/Living Environment:     ASAM Severity Score: ASAM's Severity Rating Score: 16  ASAM Recommended Level of Treatment: ASAM Recommended Level of Treatment: Level III Residential Treatment   Substance use Disorder (SUD) Substance Use Disorder (SUD)  Checklist Symptoms of Substance Use: Continued use despite having a persistent/recurrent physical/psychological problem caused/exacerbated by use, Evidence of tolerance, Continued use despite persistent or recurrent social, interpersonal problems, caused or exacerbated by use, Substance(s) often taken in larger amounts or over longer times than was intended, Persistent desire or unsuccessful efforts to cut down or control use  Recommendations for Services/Supports/Treatments: Recommendations for Services/Supports/Treatments Recommendations For Services/Supports/Treatments: Inpatient Hospitalization  DSM5 Diagnoses: Patient Active Problem List   Diagnosis Date Noted   Alcohol abuse 06/19/2021   Cocaine abuse (HCC) 06/19/2021   Essential hypertension 06/19/2021   MDD (major depressive disorder) 06/18/2021   MDD (major depressive disorder), recurrent episode, severe (HCC) 06/18/2021   Suicidal ideation    Tobacco abuse    Polysubstance abuse (HCC)    Leukocytosis    Acute respiratory failure with hypoxia  (HCC) 10/06/2020    Janiene Aarons R [place], LCAS

## 2021-07-05 NOTE — ED Notes (Signed)
IVC/pending psych inpatient admission when medically cleared 

## 2021-07-05 NOTE — ED Triage Notes (Addendum)
Pt arrives with [employer] officer x2, pt appears under the influence and states he wants "to die". Pt declines to answer other questions. Per police pt is voluntary. Pt refuses vitals in triage, pt taken by [place], rn back to room 22.  ?

## 2021-07-05 NOTE — ED Notes (Signed)
Pt agreeable to blood work at this time. Blood work obtained.  ?

## 2021-07-05 NOTE — ED Notes (Signed)
IVC/pending psych consult 

## 2021-07-05 NOTE — ED Notes (Signed)
Pt. Transferred to [facility] , room# 5 from ED .Patient was screened by security before entering the unit. Report to include Situation, Background, Assessment and Recommendations from ED RN . Pt. Oriented to unit including Q15 minute rounds as well as the security cameras for their protection. Patient is alert and oriented, warm and dry in no acute distress.  ? ?

## 2021-07-05 NOTE — ED Notes (Signed)
Pt updated on need for urine sample and importance. Pt denies any need to go at this time. Will let me know when they are able to do so.  ?

## 2021-07-05 NOTE — ED Notes (Signed)
Pt provided with night time snack  

## 2021-07-05 NOTE — ED Notes (Signed)
Pt given breakfast tray. Reminded of urine sample needed. Denies further needs. Pt calm, pleasant, cooperative.  ?

## 2021-07-05 NOTE — TOC Initial Note (Signed)
Transition of Care (TOC) - Initial/Assessment Note  ? ? ?Patient Details  ?Name: Francisco Barrett ?MRN: [id number] ?Date of Birth: 01/30/1981 ? ?Transition of Care (TOC) CM/SW Contact:    ?Corran Lalone L Joelle [place], LCSWA ?Phone Number: ?07/05/2021, 3:18 PM ? ?Clinical Narrative:                 ? ?Patient arrived to [facility] endorsing suicidal ideation. He was found by police laying in the middle of the road. Patient reported consuming cocaine and alcohol. Patient has history of SI, depression, and substance use. CSW attached outpatient and residential substance use programs to patient's discharge instructions.  ? ?TOC will continue to follow. ?  ?  ? ? ?Patient Goals and CMS Choice ?  ?  ?  ? ?Expected Discharge Plan and Services ?  ?  ?  ?  ?  ?                ?  ?  ?  ?  ?  ?  ?  ?  ?  ?  ? ?Prior Living Arrangements/Services ?  ?  ?  ?       ?  ?  ?  ?  ? ?Activities of Daily Living ?  ?  ? ?Permission Sought/Granted ?  ?  ?   ?   ?   ?   ? ?Emotional Assessment ?  ?  ?  ?  ?  ?  ? ?Admission diagnosis:  Beh Med Eval voluntary ?Patient Active Problem List  ? Diagnosis Date Noted  ? Alcohol-induced mood disorder (HCC)   ? Alcohol abuse 06/19/2021  ? Cocaine abuse (HCC) 06/19/2021  ? Essential hypertension 06/19/2021  ? MDD (major depressive disorder) 06/18/2021  ? MDD (major depressive disorder), recurrent episode, severe (HCC) 06/18/2021  ? Suicidal ideation   ? Tobacco abuse   ? Polysubstance abuse (HCC)   ? Leukocytosis   ? Acute respiratory failure with hypoxia (HCC) 10/06/2020  ? ?PCP:  Pcp, No ?Pharmacy:   ?[facility] ?[address], [address] ?[place] [address] ?Phone: [phone] Fax: [phone] ? ? ? ? ?Social Determinants of Health (SDOH) Interventions ?  ? ?Readmission Risk Interventions ?No flowsheet data found. ? ? ?

## 2021-07-05 NOTE — ED Notes (Signed)
Pt denied shower at this time will ask at a later time. No other needs. ?

## 2021-07-05 NOTE — BH Assessment (Signed)
Referral information for Psychiatric Hospitalization faxed to: ? ?[facility] [phone]) ? ?[facility] [phone]),  ? ?[facility] [phone]), ? ?[facility] [phone], [phone], [phone] or [phone]),  ? ?[place] [phone] or [phone]) ? ?[facility] [phone]). ? ?

## 2021-07-05 NOTE — BH Assessment (Signed)
This Clinical research associate spoke with [employer] [phone]). Joyce Gross requested UDS and EKG results for pt. As pt is currently under review. Nursing and EDP notified via secure chat. ?

## 2021-07-05 NOTE — Consult Note (Signed)
[facility] Face-to-Face Psychiatry Consult   Reason for Consult: Consult for 41 year old man with a history of depression and substance abuse comes back into the hospital reporting suicidal ideation Referring Physician: Erma Heritage Patient Identification: Francisco Barrett MRN:  [id number] Principal Diagnosis: MDD (major depressive disorder), recurrent episode, severe (HCC) Diagnosis:  Principal Problem:   MDD (major depressive disorder), recurrent episode, severe (HCC) Active Problems:   Polysubstance abuse (HCC)   Suicidal ideation   Total Time spent with patient: 1 hour  Subjective:   Francisco Barrett is a 41 y.o. male patient admitted with "the same old stuff".  HPI: Patient seen and chart reviewed.  41 year old man who was just discharged from our hospital about a week and a half ago returns to the emergency room intoxicated with drug screen positive for cocaine and alcohol level over 200.  Patient reports that he has been drinking and using cocaine and his mood has become depressed again.  He was found by police laying in the middle of the road.  He endorses suicidal ideation.  On interview the patient looks dysphoric down and sad.  Continues to have passive suicidal thoughts and feel depressed and hopeless.  Not currently complaining of any withdrawal symptoms.  Not aggressive to others.  Past Psychiatric History: Past history of chronic substance abuse with little sobriety and recurrent depressive symptoms.  Declined offers of inpatient treatment last time  Risk to Self:   Risk to Others:   Prior Inpatient Therapy:   Prior Outpatient Therapy:    Past Medical History:  Past Medical History:  Diagnosis Date   H/O ETOH abuse    Tobacco abuse    No past surgical history on file. Family History: No family history on file. Family Psychiatric  History: See previous Social History:  Social History   Substance and Sexual Activity  Alcohol Use Yes   Comment: 2 40's      Social History   Substance and Sexual Activity  Drug Use Yes   Types: Marijuana, Cocaine    Social History   Socioeconomic History   Marital status: Single    Spouse name: Not on file   Number of children: Not on file   Years of education: Not on file   Highest education level: Not on file  Occupational History   Not on file  Tobacco Use   Smoking status: Every Day    Packs/day: 0.50    Types: Cigarettes   Smokeless tobacco: Never  Vaping Use   Vaping Use: Never used  Substance and Sexual Activity   Alcohol use: Yes    Comment: 2 40's   Drug use: Yes    Types: Marijuana, Cocaine   Sexual activity: Not on file  Other Topics Concern   Not on file  Social History Narrative   Not on file   Social Determinants of Health   Financial Resource Strain: Not on file  Food Insecurity: Not on file  Transportation Needs: Not on file  Physical Activity: Not on file  Stress: Not on file  Social Connections: Not on file   Additional Social History:    Allergies:  No Known Allergies  Labs:  Results for orders placed or performed during the hospital encounter of 07/05/21 (from the past 48 hour(s))  Urine Drug Screen, Qualitative     Status: Abnormal   Collection Time: 07/05/21  3:50 AM  Result Value Ref Range   Tricyclic, Ur Screen NONE DETECTED NONE DETECTED   Amphetamines, Ur Screen NONE  DETECTED NONE DETECTED   MDMA (Ecstasy)Ur Screen NONE DETECTED NONE DETECTED   Cocaine Metabolite,Ur Hanna POSITIVE (A) NONE DETECTED   Opiate, Ur Screen NONE DETECTED NONE DETECTED   Phencyclidine (PCP) Ur S NONE DETECTED NONE DETECTED   Cannabinoid 50 Ng, Ur Rochelle POSITIVE (A) NONE DETECTED   Barbiturates, Ur Screen NONE DETECTED NONE DETECTED   Benzodiazepine, Ur Scrn NONE DETECTED NONE DETECTED   Methadone Scn, Ur NONE DETECTED NONE DETECTED    Comment: (NOTE) Tricyclics + metabolites, urine    Cutoff 1000 ng/mL Amphetamines + metabolites, urine  Cutoff 1000 ng/mL MDMA (Ecstasy),  urine              Cutoff 500 ng/mL Cocaine Metabolite, urine          Cutoff 300 ng/mL Opiate + metabolites, urine        Cutoff 300 ng/mL Phencyclidine (PCP), urine         Cutoff 25 ng/mL Cannabinoid, urine                 Cutoff 50 ng/mL Barbiturates + metabolites, urine  Cutoff 200 ng/mL Benzodiazepine, urine              Cutoff 200 ng/mL Methadone, urine                   Cutoff 300 ng/mL  The urine drug screen provides only a preliminary, unconfirmed analytical test result and should not be used for non-medical purposes. Clinical consideration and professional judgment should be applied to any positive drug screen result due to possible interfering substances. A more specific alternate chemical method must be used in order to obtain a confirmed analytical result. Gas chromatography / mass spectrometry (GC/MS) is the preferred confirm atory method. Performed at [facility], [address] [place], [place] [address]   Resp Panel by RT-PCR (Flu A&B, Covid) Nasopharyngeal Swab     Status: None   Collection Time: 07/05/21  3:54 AM   Specimen: Nasopharyngeal Swab; Nasopharyngeal(NP) swabs in vial transport medium  Result Value Ref Range   SARS Coronavirus 2 by RT PCR NEGATIVE NEGATIVE    Comment: (NOTE) SARS-CoV-2 target nucleic acids are NOT DETECTED.  The SARS-CoV-2 RNA is generally detectable in upper respiratory specimens during the acute phase of infection. The lowest concentration of SARS-CoV-2 viral copies this assay can detect is 138 copies/mL. A negative result does not preclude SARS-Cov-2 infection and should not be used as the sole basis for treatment or other patient management decisions. A negative result may occur with  improper specimen collection/handling, submission of specimen other than nasopharyngeal swab, presence of viral mutation(s) within the areas targeted by this assay, and inadequate number of viral copies(<138 copies/mL). A negative result  must be combined with clinical observations, patient history, and epidemiological information. The expected result is Negative.  Fact Sheet for Patients:  BloggerCourse.com  Fact Sheet for Healthcare Providers:  SeriousBroker.it  This test is no t yet approved or cleared by the [place] FDA and  has been authorized for detection and/or diagnosis of SARS-CoV-2 by FDA under an Emergency Use Authorization (EUA). This EUA will remain  in effect (meaning this test can be used) for the duration of the COVID-19 declaration under Section 564(b)(1) of the Act, 21 U.S.C.section 360bbb-3(b)(1), unless the authorization is terminated  or revoked sooner.       Influenza A by PCR NEGATIVE NEGATIVE   Influenza B by PCR NEGATIVE NEGATIVE    Comment: (NOTE)  The Xpert Xpress SARS-CoV-2/FLU/RSV plus assay is intended as an aid in the diagnosis of influenza from Nasopharyngeal swab specimens and should not be used as a sole basis for treatment. Nasal washings and aspirates are unacceptable for Xpert Xpress SARS-CoV-2/FLU/RSV testing.  Fact Sheet for Patients: BloggerCourse.comhttps://www.fda.gov/media/152166/download  Fact Sheet for Healthcare Providers: SeriousBroker.ithttps://www.fda.gov/media/152162/download  This test is not yet approved or cleared by the [place] FDA and has been authorized for detection and/or diagnosis of SARS-CoV-2 by FDA under an Emergency Use Authorization (EUA). This EUA will remain in effect (meaning this test can be used) for the duration of the COVID-19 declaration under Section 564(b)(1) of the Act, 21 U.S.C. section 360bbb-3(b)(1), unless the authorization is terminated or revoked.  Performed at [facility], [address] [place], [place] [address]   Comprehensive metabolic panel     Status: Abnormal   Collection Time: 07/05/21  3:54 AM  Result Value Ref Range   Sodium 143 135 - 145 mmol/L   Potassium 3.6 3.5 - 5.1  mmol/L   Chloride 108 98 - 111 mmol/L   CO2 27 22 - 32 mmol/L   Glucose, Bld 104 (H) 70 - 99 mg/dL    Comment: Glucose reference range applies only to samples taken after fasting for at least 8 hours.   BUN 12 6 - 20 mg/dL   Creatinine, Ser 6.040.77 0.61 - 1.24 mg/dL   Calcium 9.1 8.9 - 54.010.3 mg/dL   Total Protein 8.0 6.5 - 8.1 g/dL   Albumin 3.9 3.5 - 5.0 g/dL   AST 20 15 - 41 U/L   ALT 28 0 - 44 U/L   Alkaline Phosphatase 60 38 - 126 U/L   Total Bilirubin 0.3 0.3 - 1.2 mg/dL   GFR, Estimated >98>60 >11>60 mL/min    Comment: (NOTE) Calculated using the CKD-EPI Creatinine Equation (2021)    Anion gap 8 5 - 15    Comment: Performed at [facility], [address] [place], [place] [address]  Ethanol     Status: Abnormal   Collection Time: 07/05/21  3:54 AM  Result Value Ref Range   Alcohol, Ethyl (B) 226 (H) <10 mg/dL    Comment: (NOTE) Lowest detectable limit for serum alcohol is 10 mg/dL.  For medical purposes only. Performed at [facility], [address] [place], [place] [address]   CBC with Diff     Status: None   Collection Time: 07/05/21  3:54 AM  Result Value Ref Range   WBC 5.7 4.0 - 10.5 K/uL   RBC 4.43 4.22 - 5.81 MIL/uL   Hemoglobin 14.1 13.0 - 17.0 g/dL   HCT 13.041.5 86.539.0 - 78.452.0 %   MCV 93.7 80.0 - 100.0 fL   MCH 31.8 26.0 - 34.0 pg   MCHC 34.0 30.0 - 36.0 g/dL   RDW 69.611.8 29.511.5 - 28.415.5 %   Platelets 396 150 - 400 K/uL   nRBC 0.0 0.0 - 0.2 %   Neutrophils Relative % 56 %   Neutro Abs 3.2 1.7 - 7.7 K/uL   Lymphocytes Relative 36 %   Lymphs Abs 2.1 0.7 - 4.0 K/uL   Monocytes Relative 5 %   Monocytes Absolute 0.3 0.1 - 1.0 K/uL   Eosinophils Relative 1 %   Eosinophils Absolute 0.1 0.0 - 0.5 K/uL   Basophils Relative 1 %   Basophils Absolute 0.0 0.0 - 0.1 K/uL   Immature Granulocytes 1 %   Abs Immature Granulocytes 0.04 0.00 - 0.07 K/uL  Comment: Performed at [facility], [address] [place], [place] [address]  Acetaminophen level      Status: Abnormal   Collection Time: 07/05/21  3:54 AM  Result Value Ref Range   Acetaminophen (Tylenol), Serum <10 (L) 10 - 30 ug/mL    Comment: (NOTE) Therapeutic concentrations vary significantly. A range of 10-30 ug/mL  may be an effective concentration for many patients. However, some  are best treated at concentrations outside of this range. Acetaminophen concentrations >150 ug/mL at 4 hours after ingestion  and >50 ug/mL at 12 hours after ingestion are often associated with  toxic reactions.  Performed at [facility], [address] [place], [place] [address]   Salicylate level     Status: Abnormal   Collection Time: 07/05/21  3:54 AM  Result Value Ref Range   Salicylate Lvl <7.0 (L) 7.0 - 30.0 mg/dL    Comment: Performed at [facility], [address], [place], [place] [address]    Current Facility-Administered Medications  Medication Dose Route Frequency Provider Last Rate Last Admin   amLODipine (NORVASC) tablet 10 mg  10 mg Oral Daily Lasharn Bufkin, Jackquline Denmark, MD       folic acid (FOLVITE) tablet 1 mg  1 mg Oral Daily Drusilla Wampole, Jackquline Denmark, MD       LORazepam (ATIVAN) tablet 1-4 mg  1-4 mg Oral Q1H PRN Derrick Orris, Jackquline Denmark, MD       Or   LORazepam (ATIVAN) injection 1-4 mg  1-4 mg Intravenous Q1H PRN Grayce Budden T, MD       mirtazapine (REMERON) tablet 30 mg  30 mg Oral QHS Jovonda Selner T, MD       multivitamin with minerals tablet 1 tablet  1 tablet Oral Daily Christal Lagerstrom T, MD       nicotine (NICODERM CQ - dosed in mg/24 hours) patch 21 mg  21 mg Transdermal Daily Klaus Casteneda T, MD       thiamine tablet 100 mg  100 mg Oral Daily Mattilynn Forrer T, MD       Or   thiamine (B-1) injection 100 mg  100 mg Intravenous Daily Randye Treichler, Jackquline Denmark, MD       Current Outpatient Medications  Medication Sig Dispense Refill   amLODipine (NORVASC) 5 MG tablet Take 2 tablets (10 mg total) by mouth once daily. (Patient not taking: Reported on 07/05/2021) 60 tablet 1    hydrOXYzine (ATARAX) 50 MG tablet Take 1 tablet (50 mg total) by mouth once every 6 (six) hours as needed for anxiety. (Patient not taking: Reported on 07/05/2021) 60 tablet 1   mirtazapine (REMERON) 30 MG tablet Take 1 tablet (30 mg total) by mouth once daily at bedtime. (Patient not taking: Reported on 07/05/2021) 30 tablet 1   nicotine (NICODERM CQ - DOSED IN MG/24 HOURS) 21 mg/24hr patch Place 1 patch (21 mg total) onto the skin daily. (Patient not taking: Reported on 07/05/2021) 28 patch 1    Musculoskeletal: Strength & Muscle Tone: within normal limits Gait & Station: normal Patient leans: N/A            Psychiatric Specialty Exam:  Presentation  General Appearance: Disheveled; Bizarre  Eye Contact:Fair  Speech:Clear and Coherent  Speech Volume:Normal  Handedness:Right   Mood and Affect  Mood:Depressed; Dysphoric; Worthless; Hopeless  Affect:Congruent; Depressed; Tearful   Thought Process  Thought Processes:Coherent  Descriptions of Associations:Intact  Orientation:Full (Time, Place and Person)  Thought Content:WDL  History of Schizophrenia/Schizoaffective disorder:No  Duration of Psychotic Symptoms:No data recorded Hallucinations:Hallucinations: None  Ideas of Reference:None  Suicidal Thoughts:Suicidal Thoughts: Yes, Active SI Active Intent and/or Plan: With Intent; With Plan; With Means to Carry Out  Homicidal Thoughts:Homicidal Thoughts: No   Sensorium  Memory:Immediate Fair  Judgment:Poor  Insight:Poor   Executive Functions  Concentration:Poor  Attention Span:Fair  Recall:Fair  Fund of Knowledge:Fair  Language:Fair   Psychomotor Activity  Psychomotor Activity:Psychomotor Activity: Normal   Assets  Assets:Resilience   Sleep  Sleep:Sleep: Poor   Physical Exam: Physical Exam Vitals and nursing note reviewed.  Constitutional:      Appearance: Normal appearance.  HENT:     Head: Normocephalic and atraumatic.      Mouth/Throat:     Pharynx: Oropharynx is clear.  Eyes:     Pupils: Pupils are equal, round, and reactive to light.  Cardiovascular:     Rate and Rhythm: Normal rate and regular rhythm.  Pulmonary:     Effort: Pulmonary effort is normal.     Breath sounds: Normal breath sounds.  Abdominal:     General: Abdomen is flat.     Palpations: Abdomen is soft.  Musculoskeletal:        General: Normal range of motion.  Skin:    General: Skin is warm and dry.  Neurological:     General: No focal deficit present.     Mental Status: He is alert. Mental status is at baseline.  Psychiatric:        Attention and Perception: Attention normal.        Mood and Affect: Mood is depressed.        Speech: Speech normal.        Behavior: Behavior normal.        Thought Content: Thought content includes suicidal ideation.        Cognition and Memory: Cognition normal.   Review of Systems  Constitutional: Negative.   HENT: Negative.    Eyes: Negative.   Respiratory: Negative.    Cardiovascular: Negative.   Gastrointestinal: Negative.   Musculoskeletal: Negative.   Skin: Negative.   Neurological: Negative.   Psychiatric/Behavioral:  Positive for depression, substance abuse and suicidal ideas.   Blood pressure 105/68, pulse 78, temperature 99 F (37.2 C), temperature source Oral, resp. rate 15, height  (1.6 m), weight 60 kg, SpO2 97 %. Body mass index is 23.43 kg/m.  Treatment Plan Summary: Medication management and Plan restarted alcohol detox also restarted mirtazapine that he was on previously and amlodipine.  Patient meets criteria for admission and at this point we have no beds and are working on referring him out to other facilities.  Patient agreeable and cooperative.  Agree with ongoing plan.  Disposition: Recommend psychiatric Inpatient admission when medically cleared.  Mordecai Rasmussen, MD 07/05/2021 2:35 PM

## 2021-07-05 NOTE — ED Triage Notes (Signed)
Pt brought in by [employer]. Pt reports smoking crack and drinking alcohol tonight. Pt also states that he wants to die. Pt is voluntary with police. Upon entering room 22 to have pt change out into scrubs, Dr. Tamala Julian is present in the room.  ?

## 2021-07-05 NOTE — ED Notes (Signed)
Psych MD @bedside

## 2021-07-05 NOTE — Consult Note (Cosign Needed)
[facility] Face-to-Face Psychiatry Consult   Reason for Consult:  Psych evaluation Referring Physician:  Dr. [name] Patient Identification: Francisco Barrett MRN:  [id number] Principal Diagnosis: <principal problem not specified> Diagnosis:  Active Problems:   Polysubstance abuse (HCC)   Suicidal ideation   MDD (major depressive disorder), recurrent episode, severe (HCC)   Total Time spent with patient: 45 minutes  Subjective:   " I wanna die"  HPI: Francisco Barrett, 41 y.o., male patient seen via tele health by this provider; chart reviewed and consulted with Dr. Lucianne Muss on 07/05/21.  On evaluation [name] reports  that he is depressed and wants to smoke crack and kill himself.  Per triage note, pt arrives with [employer] officer x2, pt appears under the influence and states he wants "to die". Pt declines to answer other questions. On chart review, EDP states, patient presented to the ED accompanied by local law enforcement, but not under arrest, for evaluation of polysubstance abuse and suicidality.  Police report that they found him laid out in the middle of the main road.  When they went to go chat with him, they report that patient requested handcuffs and requested transport to the ED for help.   Patient is scattered on arrival to the ED. mumbling to himself.  Reports that coming to the ED was a "murder sentence."  Reports that he just wants to die and just wants to leave.  Difficult to get accurate history.   During evaluation [name] is laying on the bed; he is alert/oriented x 4; somber/tearful; and mood congruent with affect.  Patient is speaking in a clear tone at moderate volume, and normal pace; with fair eye contact.  His thought process is coherent and relevant; There is no indication that he is currently responding to internal/external stimuli or experiencing delusional thought content.  Patient endorses suicidal/self-harm/ and denies homicidal  ideation, psychosis, and paranoia.  He says he wants to go to rehab but that its hard to remain sober. Patient has been tearful and wishing he would be released to go die.  He says noone loves him and therefore he has no reason to live.    Recommendation:  Inpatient hospitalization  Past Psychiatric History: Per Dr. Darlin Drop, Patient has history of depression by his report but has never been on any medicine or received specific treatment for depression.  No past suicide attempts.  Only past mental health treatment has been for substance abuse.  He says he has been to [facility] several times in the past and completed the programs and been able to stay sober for extended periods of time  Risk to Self:  yes Risk to Others:  no Prior Inpatient Therapy:   Prior Outpatient Therapy:    Past Medical History:  Past Medical History:  Diagnosis Date   H/O ETOH abuse    Tobacco abuse    No past surgical history on file. Family History: No family history on file. Family Psychiatric  History: unknown Social History:  Social History   Substance and Sexual Activity  Alcohol Use Yes   Comment: 2 40's     Social History   Substance and Sexual Activity  Drug Use Yes   Types: Marijuana, Cocaine    Social History   Socioeconomic History   Marital status: Single    Spouse name: Not on file   Number of children: Not on file   Years of education: Not on file   Highest education level: Not  on file  Occupational History   Not on file  Tobacco Use   Smoking status: Every Day    Packs/day: 0.50    Types: Cigarettes   Smokeless tobacco: Never  Vaping Use   Vaping Use: Never used  Substance and Sexual Activity   Alcohol use: Yes    Comment: 2 40's   Drug use: Yes    Types: Marijuana, Cocaine   Sexual activity: Not on file  Other Topics Concern   Not on file  Social History Narrative   Not on file   Social Determinants of Health   Financial Resource Strain: Not on file  Food  Insecurity: Not on file  Transportation Needs: Not on file  Physical Activity: Not on file  Stress: Not on file  Social Connections: Not on file   Additional Social History:    Allergies:  No Known Allergies  Labs:  Results for orders placed or performed during the hospital encounter of 07/05/21 (from the past 48 hour(s))  Comprehensive metabolic panel     Status: Abnormal   Collection Time: 07/05/21  3:54 AM  Result Value Ref Range   Sodium 143 135 - 145 mmol/L   Potassium 3.6 3.5 - 5.1 mmol/L   Chloride 108 98 - 111 mmol/L   CO2 27 22 - 32 mmol/L   Glucose, Bld 104 (H) 70 - 99 mg/dL    Comment: Glucose reference range applies only to samples taken after fasting for at least 8 hours.   BUN 12 6 - 20 mg/dL   Creatinine, Ser 4.090.77 0.61 - 1.24 mg/dL   Calcium 9.1 8.9 - 81.110.3 mg/dL   Total Protein 8.0 6.5 - 8.1 g/dL   Albumin 3.9 3.5 - 5.0 g/dL   AST 20 15 - 41 U/L   ALT 28 0 - 44 U/L   Alkaline Phosphatase 60 38 - 126 U/L   Total Bilirubin 0.3 0.3 - 1.2 mg/dL   GFR, Estimated >91>60 >47>60 mL/min    Comment: (NOTE) Calculated using the CKD-EPI Creatinine Equation (2021)    Anion gap 8 5 - 15    Comment: Performed at [facility], [address] [place], [place] [address]  CBC with Diff     Status: None   Collection Time: 07/05/21  3:54 AM  Result Value Ref Range   WBC 5.7 4.0 - 10.5 K/uL   RBC 4.43 4.22 - 5.81 MIL/uL   Hemoglobin 14.1 13.0 - 17.0 g/dL   HCT 21.341.5 08.639.0 - 57.852.0 %   MCV 93.7 80.0 - 100.0 fL   MCH 31.8 26.0 - 34.0 pg   MCHC 34.0 30.0 - 36.0 g/dL   RDW 46.911.8 62.911.5 - 52.815.5 %   Platelets 396 150 - 400 K/uL   nRBC 0.0 0.0 - 0.2 %   Neutrophils Relative % 56 %   Neutro Abs 3.2 1.7 - 7.7 K/uL   Lymphocytes Relative 36 %   Lymphs Abs 2.1 0.7 - 4.0 K/uL   Monocytes Relative 5 %   Monocytes Absolute 0.3 0.1 - 1.0 K/uL   Eosinophils Relative 1 %   Eosinophils Absolute 0.1 0.0 - 0.5 K/uL   Basophils Relative 1 %   Basophils Absolute 0.0 0.0 - 0.1 K/uL    Immature Granulocytes 1 %   Abs Immature Granulocytes 0.04 0.00 - 0.07 K/uL    Comment: Performed at [facility], [address] [place], [place] [address]  Acetaminophen level     Status: Abnormal   Collection  Time: 07/05/21  3:54 AM  Result Value Ref Range   Acetaminophen (Tylenol), Serum <10 (L) 10 - 30 ug/mL    Comment: (NOTE) Therapeutic concentrations vary significantly. A range of 10-30 ug/mL  may be an effective concentration for many patients. However, some  are best treated at concentrations outside of this range. Acetaminophen concentrations >150 ug/mL at 4 hours after ingestion  and >50 ug/mL at 12 hours after ingestion are often associated with  toxic reactions.  Performed at [facility], [address] [place], [place] [address]   Salicylate level     Status: Abnormal   Collection Time: 07/05/21  3:54 AM  Result Value Ref Range   Salicylate Lvl <7.0 (L) 7.0 - 30.0 mg/dL    Comment: Performed at [facility], [address], [place], [place] [address]    Current Facility-Administered Medications  Medication Dose Route Frequency Provider Last Rate Last Admin   diphenhydrAMINE (BENADRYL) 50 MG/ML injection            haloperidol lactate (HALDOL) 5 MG/ML injection            LORazepam (ATIVAN) 2 MG/ML injection            Current Outpatient Medications  Medication Sig Dispense Refill   amLODipine (NORVASC) 5 MG tablet Take 2 tablets (10 mg total) by mouth once daily. (Patient not taking: Reported on 07/05/2021) 60 tablet 1   hydrOXYzine (ATARAX) 50 MG tablet Take 1 tablet (50 mg total) by mouth once every 6 (six) hours as needed for anxiety. (Patient not taking: Reported on 07/05/2021) 60 tablet 1   mirtazapine (REMERON) 30 MG tablet Take 1 tablet (30 mg total) by mouth once daily at bedtime. (Patient not taking: Reported on 07/05/2021) 30 tablet 1   nicotine (NICODERM CQ - DOSED IN MG/24 HOURS) 21 mg/24hr patch Place 1 patch (21 mg total)  onto the skin daily. (Patient not taking: Reported on 07/05/2021) 28 patch 1    Musculoskeletal: Strength & Muscle Tone: within normal limits Gait & Station: normal Patient leans: N/A            Psychiatric Specialty Exam:  Presentation  General Appearance: Disheveled; Bizarre  Eye Contact:Fair  Speech:Clear and Coherent  Speech Volume:Normal  Handedness:Right   Mood and Affect  Mood:Depressed; Dysphoric; Worthless; Hopeless  Affect:Congruent; Depressed; Tearful   Thought Process  Thought Processes:Coherent  Descriptions of Associations:Intact  Orientation:Full (Time, Place and Person)  Thought Content:WDL  History of Schizophrenia/Schizoaffective disorder:No  Duration of Psychotic Symptoms:No data recorded Hallucinations:Hallucinations: None  Ideas of Reference:None  Suicidal Thoughts:Suicidal Thoughts: Yes, Active SI Active Intent and/or Plan: With Intent; With Plan; With Means to Carry Out  Homicidal Thoughts:Homicidal Thoughts: No   Sensorium  Memory:Immediate Fair  Judgment:Poor  Insight:Poor   Executive Functions  Concentration:Poor  Attention Span:Fair  Recall:Fair  Fund of Knowledge:Fair  Language:Fair   Psychomotor Activity  Psychomotor Activity:Psychomotor Activity: Normal   Assets  Assets:Resilience   Sleep  Sleep:Sleep: Poor   Physical Exam: Physical Exam Vitals and nursing note reviewed.  HENT:     Head: Normocephalic and atraumatic.     Nose: Nose normal.     Mouth/Throat:     Mouth: Mucous membranes are dry.  Eyes:     Pupils: Pupils are equal, round, and reactive to light.  Pulmonary:     Effort: Pulmonary effort is normal.  Musculoskeletal:        General: Normal range of motion.     Cervical  back: Normal range of motion.  Skin:    General: Skin is warm.  Neurological:     Mental Status: He is alert.  Psychiatric:        Attention and Perception: Attention normal.        Mood and Affect:  Mood is anxious and depressed. Affect is tearful.        Speech: Speech normal.        Behavior: Behavior is cooperative.        Thought Content: Thought content includes suicidal ideation. Thought content includes suicidal plan.        Cognition and Memory: Cognition is impaired. Memory is impaired.        Judgment: Judgment is impulsive and inappropriate.   ROS Blood pressure (!) 142/107, pulse 76, temperature 97.9 F (36.6 C), resp. rate (!) 22, height  (1.6 m), weight 60 kg, SpO2 96 %. Body mass index is 23.43 kg/m.  Treatment Plan Summary: Medication management  Encourage regular attendance with groups and working with treatment team on appropriate outpatient follow-up.   Disposition: Recommend psychiatric Inpatient admission when medically cleared. Supportive therapy provided about ongoing stressors. Discussed crisis plan, support from social network, calling 911, coming to the Emergency Department, and calling Suicide Hotline.  Jearld Lesch, NP 07/05/2021 4:22 AM

## 2021-07-05 NOTE — ED Notes (Signed)
Pt given lunch and water. Urine sample sent. Denies further needs.  ?

## 2021-07-05 NOTE — ED Notes (Addendum)
Pt brought back to room 22 escorted by police. Pt is changed into maroon scrubs. All personal items  ( 2 shoes, 2 socks, underwear, shirt, coat, cigarettes, wallet, and phone) are placed in pt belonging bag, labeled with pt stickers and placed at nurse desk. Security as well as ED provider are present at this time. Sitter and security present at bedside.  ?

## 2021-07-05 NOTE — BH Assessment (Signed)
Referral checks ? ?Francisco Barrett [phone]), No answer ?  ?Francisco Barrett [phone]), No intake available at night. ?  ?Francisco Barrett [phone], [phone] or [phone]), Unable to reach anyone ?  ?Francisco Barrett [phone] or [phone]) Per Francisco Barrett, there are no beds available at this time. ?  ?Francisco Barrett [phone]). Phone number is no longer in working order ? ?Referral re-faxed to [facility] [phone]) after hours at 8:01 PM.  ?

## 2021-07-05 NOTE — ED Notes (Signed)
Hospital meal provided, pt tolerated w/o complaints.  Waste discarded appropriately.  

## 2021-07-05 NOTE — ED Provider Notes (Addendum)
? ?[facility] ?Provider Note ? ? ? Event Date/Time  ? First MD Initiated Contact with Patient 07/05/21 [phone]   ?  (approximate) ? ? ?History  ? ?Suicidal, Psychiatric Evaluation, and Drug / Alcohol Assessment ? ? ?HPI ? ?Francisco Barrett is a 41 y.o. male who presents to the ED for evaluation of Suicidal, Psychiatric Evaluation, and Drug / Alcohol Assessment ?  ?I reviewed DC summary from 2/28 associated with his psychiatric admission for depression, polysubstance abuse.  ? ?Patient presents to the ED accompanied by local law enforcement, but not under arrest, for evaluation of polysubstance abuse and suicidality.  Police report that they found him laid out in the middle of the main road.  When they went to go chat with him, they report that patient requested handcuffs and requested transport to the ED for help. ? ?Patient is scattered on arrival to the ED. mumbling to himself.  Reports that coming to the ED was a "murder sentence."  Reports that he just wants to die and just wants to leave.  Difficult to get accurate history. ? ? ?Physical Exam  ? ?Triage Vital Signs: ?ED Triage Vitals [07/05/21 0347]  ?Enc Vitals Group  ?   BP   ?   Pulse   ?   Resp   ?   Temp   ?   Temp src   ?   SpO2   ?   Weight 132 lb 4.4 oz (60 kg)  ?   Height 5\' 3"  (1.6 m)  ?   Head Circumference   ?   Peak Flow   ?   Pain Score   ?   Pain Loc   ?   Pain Edu?   ?   Excl. in [place]?   ? ? ?Most recent vital signs: ?Vitals:  ? 07/05/21 0351  ?BP: (!) 142/107  ?Pulse: 76  ?Resp: (!) 22  ?Temp: 97.9 ?F (36.6 ?C)  ?SpO2: 96%  ? ? ?General: Awake.  Restless with tangential thoughts.  Ambulatory ?CV:  Good peripheral perfusion.  ?Resp:  Normal effort.  ?Abd:  No distention.  ?MSK:  No deformity noted.  No signs of trauma ?Neuro:  No focal deficits appreciated. ?Other:   ? ? ?ED Results / Procedures / Treatments  ? ?Labs ?(all labs ordered are listed, but only abnormal results are displayed) ?Labs Reviewed  ?COMPREHENSIVE  METABOLIC PANEL - Abnormal; Notable for the following components:  ?    Result Value  ? Glucose, Bld 104 (*)   ? All other components within normal limits  ?ETHANOL - Abnormal; Notable for the following components:  ? Alcohol, Ethyl (B) 226 (*)   ? All other components within normal limits  ?ACETAMINOPHEN LEVEL - Abnormal; Notable for the following components:  ? Acetaminophen (Tylenol), Serum <10 (*)   ? All other components within normal limits  ?SALICYLATE LEVEL - Abnormal; Notable for the following components:  ? Salicylate Lvl [id number] (*)   ? All other components within normal limits  ?RESP PANEL BY RT-PCR (FLU A&B, COVID) ARPGX2  ?CBC WITH DIFFERENTIAL/PLATELET  ?URINE DRUG SCREEN, QUALITATIVE ([facility] ONLY)  ? ? ?EKG ?Sinus rhythm, rate of 78 bpm.  Normal axis and intervals.  No evidence of acute ischemia. ? ?RADIOLOGY ? ? ?Official radiology report(s): ?No results found. ? ?PROCEDURES and INTERVENTIONS: ? ?Procedures ? ?Medications - No data to display ? ? ? ?IMPRESSION / MDM / ASSESSMENT AND PLAN / ED COURSE  ?I reviewed  the triage vital signs and the nursing notes. ? ?41 year old male presents to the ED voluntarily with suicidal thoughts and polysubstance abuse requiring IVC and psychiatric evaluation.  He is scattered and with tangential thought processes.  No signs of neurologic or vascular deficits.  No signs of trauma.  Elevated ethanol level, but otherwise benign blood work.  No evidence of particular toxidrome.  CBC and CMP are normal.  I see no medical barriers to psychiatric evaluation and disposition.  We will consult psychiatry.  We will hold under IVC due to threats of harming himself and others. ? ?Clinical Course as of 07/05/21 0431  ?Sat Jul 05, 2021  ?0350 The patient has been placed in psychiatric observation due to the need to provide a safe environment for the patient while obtaining psychiatric consultation and evaluation, as well as ongoing medical and medication management to treat the  patient's condition.  The patient has been placed under full IVC at this time. ? ? [DS]  ?  ?Clinical Course User Index ?[DS] Vladimir Crofts, MD  ? ? ? ?FINAL CLINICAL IMPRESSION(S) / ED DIAGNOSES  ? ?Final diagnoses:  ?Suicidal ideation  ?Alcohol-induced mood disorder ([place])  ? ? ? ?Rx / DC Orders  ? ?ED Discharge Orders   ? ? None  ? ?  ? ? ? ?Note:  This document was prepared using Dragon voice recognition software and may include unintentional dictation errors. ?  ?Vladimir Crofts, MD ?07/05/21 [phone] ? ?  ?Vladimir Crofts, MD ?07/05/21 [phone] ? ?

## 2021-07-05 NOTE — ED Notes (Signed)
Pt cooperative at this time. Pt admits to drinking alcohol tonight and smoking crack. Pt unable to say how much. When asked if pt wants to hurt himself, pt states, " yeah I want to die, I just lost my house and job man, Im depressed". Pt admits to wanting to hurt himself but denies any specific plan at this time. Environment is secured. Pt remains in scrubs. Sitter and security present. ?

## 2021-07-05 NOTE — BH Assessment (Signed)
Writer faxed requested information to [facility] [place] multiple times to follow up but was unable to reach anyone. Left voicemail message requesting return phone call. ? ?Writer refaxed information with complete labs to the other facilities that it  was previous sent to. ?__________________________________ ?[facility] [phone]),  ? ?[facility] [phone]), ? ?[facility] [phone], [phone], [phone] or [phone]),  ? ?[facility] [phone] or [phone]) ? ?[facility] [phone]). ?

## 2021-07-06 NOTE — ED Provider Notes (Signed)
Cleared for discharge by psychiatry, they have rescinded IVC ?  ?Lavonia Drafts, MD ?07/06/21 1440 ? ?

## 2021-07-06 NOTE — ED Notes (Signed)
Report received from Dawn, RN including SBAR. On initial round after report Pt is warm/dry, resting quietly in room without any s/s of distress.  Will continue to monitor throughout shift as ordered for any changes in behaviors and for continued safety.   ?

## 2021-07-06 NOTE — ED Notes (Signed)
Psych MD at bedside

## 2021-07-06 NOTE — ED Notes (Signed)
IVC pending placement 

## 2021-07-06 NOTE — Consult Note (Signed)
[facility] Face-to-Face Psychiatry Consult   Reason for Consult: Follow-up consult 41 year old man with a history of substance abuse came into the hospital intoxicated and saying he was suicidal. Referring Physician: Cyril Loosen Patient Identification: Francisco Barrett MRN:  [id number] Principal Diagnosis: MDD (major depressive disorder), recurrent episode, severe (HCC) Diagnosis:  Principal Problem:   MDD (major depressive disorder), recurrent episode, severe (HCC) Active Problems:   Polysubstance abuse (HCC)   Suicidal ideation   Total Time spent with patient: 30 minutes  Subjective:   Francisco Barrett is a 41 y.o. male patient admitted with "I am ready to go".  HPI: Patient seen and chart reviewed.  Patient known from previous hospitalization.  41 year old man presented to the emergency room intoxicated saying he was having suicidal thoughts again and laying out in the road.  Patient was thought initially to need inpatient hospitalization.  He has now become sober.  He is not having any active symptoms of withdrawal.  Not shaking no hallucinations no agitation.  He is fully lucid and clear in his thoughts.  Patient says he has no suicidal ideation and he does not believe he needs inpatient admission.  He believes that he primarily needs to engage in substance abuse treatment.  He was offered the option of admission with the goal of getting into rehab but says he thinks he will be safe if he goes to [facility] Monday morning and that is his plan.  He has not done anything to harm himself in the hospital.  Physically stable.  Past Psychiatric History: History of previous hospitalization under similar circumstances.  At that time was offered rehab and eventually declined and went home.  Risk to Self:   Risk to Others:   Prior Inpatient Therapy:   Prior Outpatient Therapy:    Past Medical History:  Past Medical History:  Diagnosis Date   H/O ETOH abuse    Tobacco abuse    History  reviewed. No pertinent surgical history. Family History: History reviewed. No pertinent family history. Family Psychiatric  History: See previous Social History:  Social History   Substance and Sexual Activity  Alcohol Use Yes   Comment: 2 40's     Social History   Substance and Sexual Activity  Drug Use Yes   Types: Marijuana, Cocaine    Social History   Socioeconomic History   Marital status: Single    Spouse name: Not on file   Number of children: Not on file   Years of education: Not on file   Highest education level: Not on file  Occupational History   Not on file  Tobacco Use   Smoking status: Every Day    Packs/day: 0.50    Types: Cigarettes   Smokeless tobacco: Never  Vaping Use   Vaping Use: Never used  Substance and Sexual Activity   Alcohol use: Yes    Comment: 2 40's   Drug use: Yes    Types: Marijuana, Cocaine   Sexual activity: Not on file  Other Topics Concern   Not on file  Social History Narrative   Not on file   Social Determinants of Health   Financial Resource Strain: Not on file  Food Insecurity: Not on file  Transportation Needs: Not on file  Physical Activity: Not on file  Stress: Not on file  Social Connections: Not on file   Additional Social History:    Allergies:  No Known Allergies  Labs:  Results for orders placed or performed during the hospital encounter  of 07/05/21 (from the past 48 hour(s))  Urine Drug Screen, Qualitative     Status: Abnormal   Collection Time: 07/05/21  3:50 AM  Result Value Ref Range   Tricyclic, Ur Screen NONE DETECTED NONE DETECTED   Amphetamines, Ur Screen NONE DETECTED NONE DETECTED   MDMA (Ecstasy)Ur Screen NONE DETECTED NONE DETECTED   Cocaine Metabolite,Ur [place] POSITIVE (A) NONE DETECTED   Opiate, Ur Screen NONE DETECTED NONE DETECTED   Phencyclidine (PCP) Ur S NONE DETECTED NONE DETECTED   Cannabinoid 50 Ng, Ur [place] POSITIVE (A) NONE DETECTED   Barbiturates, Ur Screen NONE DETECTED NONE  DETECTED   Benzodiazepine, Ur Scrn NONE DETECTED NONE DETECTED   Methadone Scn, Ur NONE DETECTED NONE DETECTED    Comment: (NOTE) Tricyclics + metabolites, urine    Cutoff 1000 ng/mL Amphetamines + metabolites, urine  Cutoff 1000 ng/mL MDMA (Ecstasy), urine              Cutoff 500 ng/mL Cocaine Metabolite, urine          Cutoff 300 ng/mL Opiate + metabolites, urine        Cutoff 300 ng/mL Phencyclidine (PCP), urine         Cutoff 25 ng/mL Cannabinoid, urine                 Cutoff 50 ng/mL Barbiturates + metabolites, urine  Cutoff 200 ng/mL Benzodiazepine, urine              Cutoff 200 ng/mL Methadone, urine                   Cutoff 300 ng/mL  The urine drug screen provides only a preliminary, unconfirmed analytical test result and should not be used for non-medical purposes. Clinical consideration and professional judgment should be applied to any positive drug screen result due to possible interfering substances. A more specific alternate chemical method must be used in order to obtain a confirmed analytical result. Gas chromatography / mass spectrometry (GC/MS) is the preferred confirm atory method. Performed at [facility], [address] [place], [place] [address]   Resp Panel by RT-PCR (Flu A&B, Covid) Nasopharyngeal Swab     Status: None   Collection Time: 07/05/21  3:54 AM   Specimen: Nasopharyngeal Swab; Nasopharyngeal(NP) swabs in vial transport medium  Result Value Ref Range   SARS Coronavirus 2 by RT PCR NEGATIVE NEGATIVE    Comment: (NOTE) SARS-CoV-2 target nucleic acids are NOT DETECTED.  The SARS-CoV-2 RNA is generally detectable in upper respiratory specimens during the acute phase of infection. The lowest concentration of SARS-CoV-2 viral copies this assay can detect is 138 copies/mL. A negative result does not preclude SARS-Cov-2 infection and should not be used as the sole basis for treatment or other patient management decisions. A negative result  may occur with  improper specimen collection/handling, submission of specimen other than nasopharyngeal swab, presence of viral mutation(s) within the areas targeted by this assay, and inadequate number of viral copies(<138 copies/mL). A negative result must be combined with clinical observations, patient history, and epidemiological information. The expected result is Negative.  Fact Sheet for Patients:  BloggerCourse.com  Fact Sheet for Healthcare Providers:  SeriousBroker.it  This test is no t yet approved or cleared by the [place] FDA and  has been authorized for detection and/or diagnosis of SARS-CoV-2 by FDA under an Emergency Use Authorization (EUA). This EUA will remain  in effect (meaning this test can be used) for the duration of  the COVID-19 declaration under Section 564(b)(1) of the Act, 21 U.S.C.section 360bbb-3(b)(1), unless the authorization is terminated  or revoked sooner.       Influenza A by PCR NEGATIVE NEGATIVE   Influenza B by PCR NEGATIVE NEGATIVE    Comment: (NOTE) The Xpert Xpress SARS-CoV-2/FLU/RSV plus assay is intended as an aid in the diagnosis of influenza from Nasopharyngeal swab specimens and should not be used as a sole basis for treatment. Nasal washings and aspirates are unacceptable for Xpert Xpress SARS-CoV-2/FLU/RSV testing.  Fact Sheet for Patients: BloggerCourse.com  Fact Sheet for Healthcare Providers: SeriousBroker.it  This test is not yet approved or cleared by the [place] FDA and has been authorized for detection and/or diagnosis of SARS-CoV-2 by FDA under an Emergency Use Authorization (EUA). This EUA will remain in effect (meaning this test can be used) for the duration of the COVID-19 declaration under Section 564(b)(1) of the Act, 21 U.S.C. section 360bbb-3(b)(1), unless the authorization is terminated  or revoked.  Performed at [facility], [address] [place], [place] [address]   Comprehensive metabolic panel     Status: Abnormal   Collection Time: 07/05/21  3:54 AM  Result Value Ref Range   Sodium 143 135 - 145 mmol/L   Potassium 3.6 3.5 - 5.1 mmol/L   Chloride 108 98 - 111 mmol/L   CO2 27 22 - 32 mmol/L   Glucose, Bld 104 (H) 70 - 99 mg/dL    Comment: Glucose reference range applies only to samples taken after fasting for at least 8 hours.   BUN 12 6 - 20 mg/dL   Creatinine, Ser 6.04 0.61 - 1.24 mg/dL   Calcium 9.1 8.9 - 54.0 mg/dL   Total Protein 8.0 6.5 - 8.1 g/dL   Albumin 3.9 3.5 - 5.0 g/dL   AST 20 15 - 41 U/L   ALT 28 0 - 44 U/L   Alkaline Phosphatase 60 38 - 126 U/L   Total Bilirubin 0.3 0.3 - 1.2 mg/dL   GFR, Estimated >98 >11 mL/min    Comment: (NOTE) Calculated using the CKD-EPI Creatinine Equation (2021)    Anion gap 8 5 - 15    Comment: Performed at [facility], [address] [place], [place] [address]  Ethanol     Status: Abnormal   Collection Time: 07/05/21  3:54 AM  Result Value Ref Range   Alcohol, Ethyl (B) 226 (H) <10 mg/dL    Comment: (NOTE) Lowest detectable limit for serum alcohol is 10 mg/dL.  For medical purposes only. Performed at [facility], [address] [place], [place] [address]   CBC with Diff     Status: None   Collection Time: 07/05/21  3:54 AM  Result Value Ref Range   WBC 5.7 4.0 - 10.5 K/uL   RBC 4.43 4.22 - 5.81 MIL/uL   Hemoglobin 14.1 13.0 - 17.0 g/dL   HCT 13.0 86.5 - 78.4 %   MCV 93.7 80.0 - 100.0 fL   MCH 31.8 26.0 - 34.0 pg   MCHC 34.0 30.0 - 36.0 g/dL   RDW 69.6 29.5 - 28.4 %   Platelets 396 150 - 400 K/uL   nRBC 0.0 0.0 - 0.2 %   Neutrophils Relative % 56 %   Neutro Abs 3.2 1.7 - 7.7 K/uL   Lymphocytes Relative 36 %   Lymphs Abs 2.1 0.7 - 4.0 K/uL   Monocytes Relative 5 %   Monocytes Absolute 0.3 0.1 - 1.0 K/uL  Eosinophils Relative 1 %   Eosinophils Absolute 0.1 0.0  - 0.5 K/uL   Basophils Relative 1 %   Basophils Absolute 0.0 0.0 - 0.1 K/uL   Immature Granulocytes 1 %   Abs Immature Granulocytes 0.04 0.00 - 0.07 K/uL    Comment: Performed at [facility], [address] [place], [place] [address]  Acetaminophen level     Status: Abnormal   Collection Time: 07/05/21  3:54 AM  Result Value Ref Range   Acetaminophen (Tylenol), Serum <10 (L) 10 - 30 ug/mL    Comment: (NOTE) Therapeutic concentrations vary significantly. A range of 10-30 ug/mL  may be an effective concentration for many patients. However, some  are best treated at concentrations outside of this range. Acetaminophen concentrations >150 ug/mL at 4 hours after ingestion  and >50 ug/mL at 12 hours after ingestion are often associated with  toxic reactions.  Performed at [facility], [address] [place], [place] [address]   Salicylate level     Status: Abnormal   Collection Time: 07/05/21  3:54 AM  Result Value Ref Range   Salicylate Lvl <7.0 (L) 7.0 - 30.0 mg/dL    Comment: Performed at [facility], [address] [place], [place] [address]    Current Facility-Administered Medications  Medication Dose Route Frequency Provider Last Rate Last Admin   amLODipine (NORVASC) tablet 10 mg  10 mg Oral Daily Meshelle Holness, Jackquline DenmarkJohn T, MD   10 mg at 07/06/21 0850   folic acid (FOLVITE) tablet 1 mg  1 mg Oral Daily Dyke Weible, Jackquline DenmarkJohn T, MD   1 mg at 07/06/21 0851   LORazepam (ATIVAN) tablet 1-4 mg  1-4 mg Oral Q1H PRN Genie Mirabal, Jackquline DenmarkJohn T, MD       Or   LORazepam (ATIVAN) injection 1-4 mg  1-4 mg Intravenous Q1H PRN Cutler Sunday T, MD       mirtazapine (REMERON) tablet 30 mg  30 mg Oral QHS Chaka Jefferys T, MD   30 mg at 07/05/21 2110   multivitamin with minerals tablet 1 tablet  1 tablet Oral Daily Tamella Tuccillo, Jackquline DenmarkJohn T, MD   1 tablet at 07/06/21 0851   nicotine (NICODERM CQ - dosed in mg/24 hours) patch 21 mg  21 mg Transdermal Daily Nesbit Michon, Jackquline DenmarkJohn T, MD   21 mg at 07/06/21 0854    thiamine tablet 100 mg  100 mg Oral Daily Loveda Colaizzi T, MD   100 mg at 07/06/21 [id number]   Or   thiamine (B-1) injection 100 mg  100 mg Intravenous Daily Renelda Kilian, Jackquline DenmarkJohn T, MD       Current Outpatient Medications  Medication Sig Dispense Refill   amLODipine (NORVASC) 5 MG tablet Take 2 tablets (10 mg total) by mouth once daily. (Patient not taking: Reported on 07/05/2021) 60 tablet 1   hydrOXYzine (ATARAX) 50 MG tablet Take 1 tablet (50 mg total) by mouth once every 6 (six) hours as needed for anxiety. (Patient not taking: Reported on 07/05/2021) 60 tablet 1   mirtazapine (REMERON) 30 MG tablet Take 1 tablet (30 mg total) by mouth once daily at bedtime. (Patient not taking: Reported on 07/05/2021) 30 tablet 1   nicotine (NICODERM CQ - DOSED IN MG/24 HOURS) 21 mg/24hr patch Place 1 patch (21 mg total) onto the skin daily. (Patient not taking: Reported on 07/05/2021) 28 patch 1    Musculoskeletal: Strength & Muscle Tone: within normal limits Gait & Station: normal Patient leans: N/A  Psychiatric Specialty Exam:  Presentation  General Appearance: Disheveled; Bizarre  Eye Contact:Fair  Speech:Clear and Coherent  Speech Volume:Normal  Handedness:Right   Mood and Affect  Mood:Depressed; Dysphoric; Worthless; Hopeless  Affect:Congruent; Depressed; Tearful   Thought Process  Thought Processes:Coherent  Descriptions of Associations:Intact  Orientation:Full (Time, Place and Person)  Thought Content:WDL  History of Schizophrenia/Schizoaffective disorder:No  Duration of Psychotic Symptoms:No data recorded Hallucinations:Hallucinations: None  Ideas of Reference:None  Suicidal Thoughts:Suicidal Thoughts: Yes, Active SI Active Intent and/or Plan: With Intent; With Plan; With Means to Carry Out  Homicidal Thoughts:Homicidal Thoughts: No   Sensorium  Memory:Immediate Fair  Judgment:Poor  Insight:Poor   Executive Functions   Concentration:Poor  Attention Span:Fair  Recall:Fair  Fund of Knowledge:Fair  Language:Fair   Psychomotor Activity  Psychomotor Activity:Psychomotor Activity: Normal   Assets  Assets:Resilience   Sleep  Sleep:Sleep: Poor   Physical Exam: Physical Exam Vitals and nursing note reviewed.  Constitutional:      Appearance: Normal appearance.  HENT:     Head: Normocephalic and atraumatic.     Mouth/Throat:     Pharynx: Oropharynx is clear.  Eyes:     Pupils: Pupils are equal, round, and reactive to light.  Cardiovascular:     Rate and Rhythm: Normal rate and regular rhythm.  Pulmonary:     Effort: Pulmonary effort is normal.     Breath sounds: Normal breath sounds.  Abdominal:     General: Abdomen is flat.     Palpations: Abdomen is soft.  Musculoskeletal:        General: Normal range of motion.  Skin:    General: Skin is warm and dry.  Neurological:     General: No focal deficit present.     Mental Status: He is alert. Mental status is at baseline.  Psychiatric:        Mood and Affect: Mood normal.        Thought Content: Thought content normal.   Review of Systems  Constitutional: Negative.   HENT: Negative.    Eyes: Negative.   Respiratory: Negative.    Cardiovascular: Negative.   Gastrointestinal: Negative.   Musculoskeletal: Negative.   Skin: Negative.   Neurological: Negative.   Psychiatric/Behavioral:  Positive for substance abuse. Negative for depression, hallucinations and suicidal ideas.   Blood pressure 117/75, pulse 82, temperature (!) 97 F (36.1 C), temperature source Axillary, resp. rate 18, height 5\' 3"  (1.6 m), weight 59.9 kg, SpO2 99 %. Body mass index is 23.38 kg/m.  Treatment Plan Summary: Plan patient at this point no longer meets commitment criteria.  Spoke with him about the risks of leaving the hospital especially if he goes out and drinks again.  Patient acknowledges this but feels that the appropriate safest thing to do is to  be discharged.  Case reviewed with emergency room physician.  Discontinued IVC.  Patient can be discharged from the emergency room with intention to follow up with [facility] tomorrow.  Disposition: No evidence of imminent risk to self or others at present.   Patient does not meet criteria for psychiatric inpatient admission. Supportive therapy provided about ongoing stressors. Discussed crisis plan, support from social network, calling 911, coming to the Emergency Department, and calling Suicide Hotline.  , MD 07/06/2021 2:41 PM

## 2021-07-06 NOTE — BH Assessment (Signed)
Writer spoke with the patient to complete an updated/reassessment. Patient denies SI/HI and AV/H. Patient states when he voice SI, it's when he is intoxicated and his thoughts of the past become too overwhelming. However, now that he has been in the ER, and is sober, he is no longer having thoughts of ending his life and states he want to discharge. ?

## 2021-07-06 NOTE — ED Notes (Signed)
Staff went into pt room to redraw lab, pt then stated "I don't want to go downstairs, I'd rather go back home."  Pt stated that he lives with his sister and that when he came into the hospital he was high on crack cocaine and alcohol. Mr Monje said that he was depressed that he was using again but denies ever being SI or making SI statements.  Pt requested to leave, staff informed him that his requests would be passed onto the Psych provider. ?

## 2021-07-18 ENCOUNTER — Encounter: Payer: Self-pay | Admitting: Emergency Medicine

## 2021-07-18 ENCOUNTER — Other Ambulatory Visit: Payer: Self-pay

## 2021-07-18 ENCOUNTER — Emergency Department
Admission: EM | Admit: 2021-07-18 | Discharge: 2021-07-18 | Disposition: A | Discharge status: Home / Self Care | Payer: Self-pay | Attending: Emergency Medicine | Admitting: Emergency Medicine

## 2021-07-18 DIAGNOSIS — Z765 Malingerer [conscious simulation]: Secondary | ICD-10-CM

## 2021-07-18 DIAGNOSIS — F32A Depression, unspecified: Secondary | ICD-10-CM | POA: Insufficient documentation

## 2021-07-18 DIAGNOSIS — M79605 Pain in left leg: Secondary | ICD-10-CM | POA: Insufficient documentation

## 2021-07-18 DIAGNOSIS — F19188 Other psychoactive substance abuse with other psychoactive substance-induced disorder: Secondary | ICD-10-CM | POA: Insufficient documentation

## 2021-07-18 DIAGNOSIS — F1910 Other psychoactive substance abuse, uncomplicated: Secondary | ICD-10-CM

## 2021-07-18 NOTE — ED Triage Notes (Addendum)
Patient ambulatory to triage with steady gait, without difficulty or distress noted; pt reports here for "anxiety, high blood pressure, leg hurting"; pt is homeless & has been seen multiple times for same ?

## 2021-07-18 NOTE — ED Notes (Signed)
Refused discharge vital signs.

## 2021-07-18 NOTE — ED Provider Notes (Signed)
? ?[facility] ?Provider Note ? ? ? Event Date/Time  ? First MD Initiated Contact with Patient 07/18/21 [phone]   ?  (approximate) ? ? ?History  ? ?Anxiety ? ?Level 5 caveat:  history/ROS limited by probable acute intoxication as well as an unwillingness or inability to provide additional history. ? ?HPI ? ?Francisco Barrett is a 41 y.o. male who is on his eighth visit recently to the emergency department with 1 prior behavioral medicine admission.  He is homeless and frequently reports polysubstance abuse, anxiety and/or depression, and left leg pain.  His last visit was just under 2 weeks ago and he was seen by psychiatry but the patient declined any additional care services.  However he frequently comes at night and tries to sleep in the waiting room.  When he has identified by security and told to leave then he checks and is a patient. ? ?Tonight he is sleeping in the exam room when I evaluated him.  He is not willing or able to give me a specific reason for why he is here.  When I pushed him on the question, he says "the truth is that it is my family".  He indicated that there is some kind of a verbal conflict with his family where they accused him of using drugs and alcohol and that upsets him.  Of note, he admits to using cocaine yesterday and drinking last night.  But he says that is not the problem. ? ?He is not reporting any pain at this time although he told triage that his left leg is hurting.  He says that he walks at least 10 miles every day.  He is not able to provide any additional history. ?  ? ? ?Physical Exam  ? ?Triage Vital Signs: ?ED Triage Vitals  ?Enc Vitals Group  ?   BP 07/18/21 0329 (!) 128/97  ?   Pulse Rate 07/18/21 0329 74  ?   Resp 07/18/21 0329 18  ?   Temp 07/18/21 0329 98 ?F (36.7 ?C)  ?   Temp Source 07/18/21 0329 Oral  ?   SpO2 07/18/21 0329 98 %  ?   Weight 07/18/21 0326 59.9 kg (132 lb 0.9 oz)  ?   Height 07/18/21 0326 1.6 m (5\' 3" )  ?   Head  Circumference --   ?   Peak Flow --   ?   Pain Score 07/18/21 0326 10  ?   Pain Loc --   ?   Pain Edu? --   ?   Excl. in GC? --   ? ? ?Most recent vital signs: ?Vitals:  ? 07/18/21 0329  ?BP: (!) 128/97  ?Pulse: 74  ?Resp: 18  ?Temp: 98 ?F (36.7 ?C)  ?SpO2: 98%  ? ? ? ?General: Somnolent but in no distress and awakens to loud voice and light touch. ?CV:  Good peripheral perfusion. Normal heart rate. ?Resp:  Normal effort.  ?Abd:  No distention.  ?Other:  Patient is slurring his speech consistent with acute intoxication.  He is not responding to internal stimuli and is not expressing any SI nor HI.  He is able to stand with some wobbling but is not completely steady on his feet.  No focal neurological deficits other than probable intoxication effects.  No evidence of trauma. ? ? ?ED Results / Procedures / Treatments  ? ? ? ?IMPRESSION / MDM / ASSESSMENT AND PLAN / ED COURSE  ?I reviewed the triage vital signs  and the nursing notes. ?             ?               ? ?Differential diagnosis includes, but is not limited to, malingering, polysubstance abuse, alcohol intoxication. ? ?The patient has been seen multiple times by ED physicians and by the behavioral medicine team.  He most commonly, though not always, comes at night and tries to sleep either in the waiting room or in an exam room. ? ?I suspect that malingering is the most probable reason that he is here (he states that they will not let him stay at a shelter because of his ongoing cocaine abuse).  However he does appear to be at least mildly acutely intoxicated and I am concerned that if I try to discharge him he will either escalate his behavior or may put himself accidentally or intentionally in danger to justify coming back to the emergency department. ? ?There is no indication for any acute or emergent intervention.  I will allow him to sleep for a few hours and then be discharged.  No indication for psychiatry consultation at this time. ? ?Of note, I  reviewed the psychiatry consultation note from about 12 days ago.  Dr. Toni Amend offered him some services but the patient refused and just wanted to leave and claimed he would follow-up at [facility].  He knows about the services available to him as an outpatient I will provide him with resource guide for additional clinics and homeless shelters. ? ? ? ? ?  ? ? ?FINAL CLINICAL IMPRESSION(S) / ED DIAGNOSES  ? ?Final diagnoses:  ?Polysubstance abuse (HCC)  ?Malingering  ? ? ? ?Rx / DC Orders  ? ?ED Discharge Orders   ? ? None  ? ?  ? ? ? ?Note:  This document was prepared using Dragon voice recognition software and may include unintentional dictation errors. ?  ?Loleta Rose, MD ?07/18/21 [phone] ? ?

## 2021-07-24 DIAGNOSIS — M79662 Pain in left lower leg: Secondary | ICD-10-CM | POA: Insufficient documentation

## 2021-07-24 DIAGNOSIS — G8929 Other chronic pain: Secondary | ICD-10-CM | POA: Insufficient documentation

## 2021-07-24 DIAGNOSIS — R440 Auditory hallucinations: Secondary | ICD-10-CM | POA: Insufficient documentation

## 2021-07-24 DIAGNOSIS — Z5900 Homelessness unspecified: Secondary | ICD-10-CM | POA: Insufficient documentation

## 2021-07-24 NOTE — ED Triage Notes (Signed)
Patient ambulatory to triage with steady gait, without difficulty or distress noted; pt reports that he is here "for hurting all over"; pt is homeless, has been seen multiple times for same ?

## 2021-07-25 ENCOUNTER — Emergency Department
Admission: EM | Admit: 2021-07-25 | Discharge: 2021-07-25 | Disposition: A | Discharge status: Home / Self Care | Payer: Self-pay | Attending: Emergency Medicine | Admitting: Emergency Medicine

## 2021-07-25 ENCOUNTER — Emergency Department
Admission: EM | Admit: 2021-07-25 | Discharge: 2021-07-26 | Disposition: A | Discharge status: Home / Self Care | Payer: Self-pay | Attending: Emergency Medicine | Admitting: Emergency Medicine

## 2021-07-25 ENCOUNTER — Other Ambulatory Visit: Payer: Self-pay

## 2021-07-25 ENCOUNTER — Encounter: Payer: Self-pay | Admitting: Emergency Medicine

## 2021-07-25 DIAGNOSIS — F1910 Other psychoactive substance abuse, uncomplicated: Secondary | ICD-10-CM | POA: Diagnosis present

## 2021-07-25 DIAGNOSIS — Z20822 Contact with and (suspected) exposure to covid-19: Secondary | ICD-10-CM | POA: Insufficient documentation

## 2021-07-25 DIAGNOSIS — F1410 Cocaine abuse, uncomplicated: Secondary | ICD-10-CM | POA: Diagnosis present

## 2021-07-25 DIAGNOSIS — I10 Essential (primary) hypertension: Secondary | ICD-10-CM | POA: Diagnosis present

## 2021-07-25 DIAGNOSIS — F1010 Alcohol abuse, uncomplicated: Secondary | ICD-10-CM

## 2021-07-25 DIAGNOSIS — R4589 Other symptoms and signs involving emotional state: Secondary | ICD-10-CM

## 2021-07-25 DIAGNOSIS — Y904 Blood alcohol level of 80-99 mg/100 ml: Secondary | ICD-10-CM | POA: Insufficient documentation

## 2021-07-25 DIAGNOSIS — R45851 Suicidal ideations: Secondary | ICD-10-CM

## 2021-07-25 DIAGNOSIS — R440 Auditory hallucinations: Secondary | ICD-10-CM

## 2021-07-25 DIAGNOSIS — F17200 Nicotine dependence, unspecified, uncomplicated: Secondary | ICD-10-CM | POA: Insufficient documentation

## 2021-07-25 DIAGNOSIS — G8929 Other chronic pain: Secondary | ICD-10-CM

## 2021-07-25 DIAGNOSIS — Z765 Malingerer [conscious simulation]: Secondary | ICD-10-CM | POA: Insufficient documentation

## 2021-07-25 DIAGNOSIS — F329 Major depressive disorder, single episode, unspecified: Secondary | ICD-10-CM | POA: Diagnosis present

## 2021-07-25 DIAGNOSIS — F10929 Alcohol use, unspecified with intoxication, unspecified: Secondary | ICD-10-CM

## 2021-07-25 DIAGNOSIS — F332 Major depressive disorder, recurrent severe without psychotic features: Secondary | ICD-10-CM | POA: Diagnosis present

## 2021-07-25 DIAGNOSIS — Z720 Tobacco use: Secondary | ICD-10-CM | POA: Diagnosis present

## 2021-07-25 DIAGNOSIS — Z5900 Homelessness unspecified: Secondary | ICD-10-CM

## 2021-07-25 DIAGNOSIS — F1094 Alcohol use, unspecified with alcohol-induced mood disorder: Secondary | ICD-10-CM | POA: Diagnosis not present

## 2021-07-25 LAB — BASIC METABOLIC PANEL
Anion gap: 11 (ref 5–15)
BUN: 17 mg/dL (ref 6–20)
CO2: 26 mmol/L (ref 22–32)
Calcium: 9.2 mg/dL (ref 8.9–10.3)
Chloride: 104 mmol/L (ref 98–111)
Creatinine, Ser: 0.85 mg/dL (ref 0.61–1.24)
GFR, Estimated: >60 mL/min (ref >60)
Glucose, Bld: 122 mg/dL — ABNORMAL HIGH (ref 70–99)
Potassium: 3.1 mmol/L — ABNORMAL LOW (ref 3.5–5.1)
Sodium: 141 mmol/L (ref 135–145)

## 2021-07-25 LAB — CBC
HCT: 41.4 % (ref 39.0–52.0)
Hemoglobin: 14.3 g/dL (ref 13.0–17.0)
MCH: 32.1 pg (ref 26.0–34.0)
MCHC: 34.5 g/dL (ref 30.0–36.0)
MCV: 93 fL (ref 80.0–100.0)
Platelets: 324 10*3/uL (ref 150–400)
RBC: 4.45 MIL/uL (ref 4.22–5.81)
RDW: 12.3 % (ref 11.5–15.5)
WBC: 6.2 10*3/uL (ref 4.0–10.5)
nRBC: 0 % (ref 0.0–0.2)

## 2021-07-25 LAB — SALICYLATE LEVEL
Salicylate Lvl: <7 mg/dL — ABNORMAL LOW (ref 7.0–30.0)
Salicylate Lvl: <7 mg/dL — ABNORMAL LOW (ref 7.0–30.0)

## 2021-07-25 LAB — ETHANOL
Alcohol, Ethyl (B): 133 mg/dL — ABNORMAL HIGH (ref <10)
Alcohol, Ethyl (B): 94 mg/dL — ABNORMAL HIGH (ref <10)

## 2021-07-25 LAB — RESP PANEL BY RT-PCR (FLU A&B, COVID) ARPGX2
Influenza A by PCR: NEGATIVE
Influenza B by PCR: NEGATIVE
SARS Coronavirus 2 by RT PCR: NEGATIVE

## 2021-07-25 LAB — ACETAMINOPHEN LEVEL
Acetaminophen (Tylenol), Serum: <10 ug/mL — ABNORMAL LOW (ref 10–30)
Acetaminophen (Tylenol), Serum: <10 ug/mL — ABNORMAL LOW (ref 10–30)

## 2021-07-25 MED ORDER — HYDROXYZINE HCL 25 MG PO TABS: 25.0000 mg | ORAL_TABLET | Freq: Four times a day (QID) | ORAL | Status: DC | PRN

## 2021-07-25 MED ORDER — ONDANSETRON 4 MG PO TBDP: 4.0000 mg | ORAL_TABLET | Freq: Four times a day (QID) | ORAL | Status: DC | PRN

## 2021-07-25 MED ORDER — LORAZEPAM 1 MG PO TABS: 1.0000 mg | ORAL_TABLET | Freq: Four times a day (QID) | ORAL | Status: DC | PRN

## 2021-07-25 MED ORDER — LORAZEPAM 1 MG PO TABS
1.0000 mg | ORAL_TABLET | Freq: Every day | ORAL | Status: DC
Start: 2021-07-29 — End: 2021-07-26

## 2021-07-25 MED ORDER — LORAZEPAM 1 MG PO TABS
1.0000 mg | ORAL_TABLET | Freq: Four times a day (QID) | ORAL | Status: DC
  Administered 2021-07-26 (×2): 1 mg via ORAL
  Filled 2021-07-25 (×3): qty 1

## 2021-07-25 MED ORDER — LORAZEPAM 1 MG PO TABS: 1.0000 mg | ORAL_TABLET | Freq: Three times a day (TID) | ORAL | Status: DC

## 2021-07-25 MED ORDER — ADULT MULTIVITAMIN W/MINERALS CH
1.0000 | ORAL_TABLET | Freq: Every day | ORAL | Status: DC
Start: 2021-07-25 — End: 2021-07-26
  Administered 2021-07-25 – 2021-07-26 (×2): 1 via ORAL
  Filled 2021-07-25 (×2): qty 1

## 2021-07-25 MED ORDER — LOPERAMIDE HCL 2 MG PO CAPS: 2.0000 mg | ORAL_CAPSULE | ORAL | Status: DC | PRN

## 2021-07-25 MED ORDER — LORAZEPAM 1 MG PO TABS
1.0000 mg | ORAL_TABLET | Freq: Two times a day (BID) | ORAL | Status: DC
Start: 2021-07-28 — End: 2021-07-26

## 2021-07-25 MED ORDER — THIAMINE HCL 100 MG PO TABS
100.0000 mg | ORAL_TABLET | Freq: Every day | ORAL | Status: DC
  Administered 2021-07-26: 100 mg via ORAL
  Filled 2021-07-25: qty 1

## 2021-07-25 MED ORDER — THIAMINE HCL 100 MG/ML IJ SOLN
100.0000 mg | Freq: Once | INTRAMUSCULAR | Status: DC
  Filled 2021-07-25: qty 1

## 2021-07-25 NOTE — ED Notes (Signed)
Pt to ED under IVC via [facility]. Pt states he is depressed and  "im a hard head and haven't been taking my medicine". Pt denies SI/HI/AVH.  ?Pt is calm and cooperative upon assessment.  ?

## 2021-07-25 NOTE — ED Provider Notes (Signed)
? ?[facility] ?Provider Note ? ? ? Event Date/Time  ? First MD Initiated Contact with Patient 07/25/21 0232   ?  (approximate) ? ? ?History  ? ?IVC ? ? ?HPI ? ?Francisco Barrett is a 41 y.o. male well-known to the emergency department for multiple recent visits consistent with malingering.  He reports chronic pain in his left leg and states that he walks miles every day.  He is homeless and he tends to come to the emergency department at night and try to sleep in the waiting room without being noticed.  He was seen within about an hour of the current visit by a different emergency room physician who discharged him. ? ?Patient then reportedly went to a nearby gas station and caused some sort of commotion and then threatened loudly that he would step in front of traffic to kill himself.  Please officers responded and placed him under involuntary commitment and brought him to the emergency department. ? ?The patient states that he was not causing commotion, he just does not feel that people care about him.  He admits to recent alcohol use, but he says "not much" and states that he has not used any other drugs.  He says he does not currently want to kill himself he just wants his life to stop hurting.  Then he said that he just finished walking 5 miles. ? ?  ? ? ?Physical Exam  ? ?Triage Vital Signs: ?ED Triage Vitals [07/25/21 0210]  ?Enc Vitals Group  ?   BP 138/90  ?   Pulse Rate 80  ?   Resp 18  ?   Temp 98.3 ?F (36.8 ?C)  ?   Temp src   ?   SpO2 100 %  ?   Weight   ?   Height   ?   Head Circumference   ?   Peak Flow   ?   Pain Score 8  ?   Pain Loc   ?   Pain Edu?   ?   Excl. in GC?   ? ? ?Most recent vital signs: ?Vitals:  ? 07/25/21 0210  ?BP: 138/90  ?Pulse: 80  ?Resp: 18  ?Temp: 98.3 ?F (36.8 ?C)  ?SpO2: 100%  ? ? ? ?General: Awake, no distress.  ?CV:  Good peripheral perfusion.  ?Resp:  Normal effort.  ?Abd:  No distention.  ?Other:  Ambulatory without difficulty, no limp, no  visible deformity. ? ? ?ED Results / Procedures / Treatments  ? ?Labs ?(all labs ordered are listed, but only abnormal results are displayed) ?Labs Reviewed  ?ETHANOL - Abnormal; Notable for the following components:  ?    Result Value  ? Alcohol, Ethyl (B) 94 (*)   ? All other components within normal limits  ?ACETAMINOPHEN LEVEL - Abnormal; Notable for the following components:  ? Acetaminophen (Tylenol), Serum <10 (*)   ? All other components within normal limits  ?SALICYLATE LEVEL - Abnormal; Notable for the following components:  ? Salicylate Lvl <7.0 (*)   ? All other components within normal limits  ?RESP PANEL BY RT-PCR (FLU A&B, COVID) [id number]  ? ? ? ?PROCEDURES: ? ?Critical Care performed: No ? ?Procedures ? ? ?MEDICATIONS ORDERED IN ED: ?Medications - No data to display ? ? ?IMPRESSION / MDM / ASSESSMENT AND PLAN / ED COURSE  ?I reviewed the triage vital signs and the nursing notes. ?             ?               ? ?  Differential diagnosis includes, but is not limited to, malingering, substance-induced mood disorder, alcohol intoxication, adjustment disorder, depression. ? ?The patient has been evaluated in the past for any acute abnormality with his leg and it is believed he has a degree of arthritis and walks too much.  However there is also a strong element of malingering, compounded by his substance abuse. ? ?There is no indication of an acute or emergent medical condition at this time.  His vital signs are stable and within normal limits and he is calm and cooperative.  At this moment at least he wants to be here.  I reviewed labs from earlier tonight and they are within normal limits.  I added on a salicylate level, acetaminophen level, and ethanol level.  These labs are within normal limits other than elevation of his ethanol level in the 90s. ? ?Given the circumstances and the understanding that the patient will cause more problems if he is discharged, possibly threatening others or harming himself.   At this point I would just uphold the involuntary commitment and have him be seen by psych.  I one-point in the past he was offered psychiatric admission by Dr. Toni Amend but the patient declined and left under his own recognizance. ? ?The patient has been placed in psychiatric observation due to the need to provide a safe environment for the patient while obtaining psychiatric consultation and evaluation, as well as ongoing medical and medication management to treat the patient's condition.  The patient has been placed under full IVC at this time. ? ?  ? ? ?FINAL CLINICAL IMPRESSION(S) / ED DIAGNOSES  ? ?Final diagnoses:  ?Alcoholic intoxication with complication (HCC)  ?Malingering  ?Suicidal behavior without attempted self-injury  ? ? ? ?Rx / DC Orders  ? ?ED Discharge Orders   ? ? None  ? ?  ? ? ? ?Note:  This document was prepared using Dragon voice recognition software and may include unintentional dictation errors. ?  ?Loleta Rose, MD ?07/25/21 [id number] ? ?

## 2021-07-25 NOTE — ED Notes (Signed)
Pt given lunch tray.

## 2021-07-25 NOTE — ED Notes (Signed)
Snack and beverage given. 

## 2021-07-25 NOTE — ED Triage Notes (Addendum)
Pt returns to ED in custody of [place] [employer] for IVC; pt just d/c, left to go to local store and police were called to scene for "someone trying to fight customers"; police report when they arrived he said he was going to "run out in front of traffic" ?

## 2021-07-25 NOTE — ED Notes (Signed)
IVC papers rescinded/ pt voluntary ?

## 2021-07-25 NOTE — Consult Note (Signed)
[facility] Face-to-Face Psychiatry Consult  ? ?Reason for Consult:IVC ?Referring Physician: Dr. Karma Greaser ?Patient Identification: Francisco Barrett ?MRN:  [id number] ?Principal Diagnosis: <principal problem not specified> ?Diagnosis:  Active Problems: ?  Tobacco abuse ?  Polysubstance abuse ([place]) ?  MDD (major depressive disorder) ?  Suicidal ideation ?  MDD (major depressive disorder), recurrent episode, severe ([place]) ?  Alcohol abuse ?  Cocaine abuse ([place]) ?  Essential hypertension ?  Alcohol-induced mood disorder ([place]) ? ? ?Total Time spent with patient: 45 minutes ? ?Subjective: "No, I don't want to hurt myself." ?Francisco Barrett is a 41 y.o. male patient presented to [facility] ED via law enforcement under involuntary commitment status. Per the ED triage nurses note, Pt returned to ED in the custody of [place] PD for IVC; pt just d/c left to go to a local store, and police were called to the scene for "someone trying to fight customers"; police reported when they arrived he said he was going to "run out in front of traffic." ? ?This provider saw the patient face-to-face; the chart was reviewed, and consulted with Dr. Karma Greaser on 07/25/2021 due to the patient's care. It was discussed with the EDP that the patient remained under observation overnight and will be reassessed in the a.m. to determine if he meets the criteria for psychiatric inpatient admission; he could be discharged home. ? ?On evaluation, the patient is alert and oriented x 3, calm and cooperative, and mood-congruent with affect. The patient does not appear to be responding to internal or external stimuli. Neither is the patient presenting with any delusional thinking. The patient denies auditory or visual hallucinations. The patient denies any suicidal, homicidal, or self-harm ideations. The patient is not presenting with any psychotic or paranoid behaviors.  ? ?HPI: Per Dr. Karma Greaser, Francisco Barrett is a 41 y.o. male well-known to  the emergency department for multiple recent visits consistent with malingering.  He reports chronic pain in his left leg and states that he walks miles every day.  He is homeless and he tends to come to the emergency department at night and try to sleep in the waiting room without being noticed.  He was seen within about an hour of the current visit by a different emergency room physician who discharged him. ?  ?Patient then reportedly went to a nearby gas station and caused some sort of commotion and then threatened loudly that he would step in front of traffic to kill himself.  Please officers responded and placed him under involuntary commitment and brought him to the emergency department. ?  ?The patient states that he was not causing commotion, he just does not feel that people care about him.  He admits to recent alcohol use, but he says "not much" and states that he has not used any other drugs.  He says he does not currently want to kill himself he just wants his life to stop hurting.  Then he said that he just finished walking 5 miles.  ? ?Past Psychiatric History:  ?H/O ETOH abuse ?Tobacco abuse ?  ?Risk to Self:   ?Risk to Others:   ?Prior Inpatient Therapy:   ?Prior Outpatient Therapy:   ? ?Past Medical History:  ?Past Medical History:  ?Diagnosis Date  ? H/O ETOH abuse   ? Tobacco abuse   ? No past surgical history on file. ?Family History: No family history on file. ?Family Psychiatric  History:  ?Social History:  ?Social History  ? ?Substance and Sexual Activity  ?  Alcohol Use Yes  ? Comment: 2 40's  ?   ?Social History  ? ?Substance and Sexual Activity  ?Drug Use Yes  ? Types: Marijuana, Cocaine  ?  ?Social History  ? ?Socioeconomic History  ? Marital status: Single  ?  Spouse name: Not on file  ? Number of children: Not on file  ? Years of education: Not on file  ? Highest education level: Not on file  ?Occupational History  ? Not on file  ?Tobacco Use  ? Smoking status: Every Day  ?  Packs/day:  0.50  ?  Types: Cigarettes  ? Smokeless tobacco: Never  ?Vaping Use  ? Vaping Use: Never used  ?Substance and Sexual Activity  ? Alcohol use: Yes  ?  Comment: 2 40's  ? Drug use: Yes  ?  Types: Marijuana, Cocaine  ? Sexual activity: Not on file  ?Other Topics Concern  ? Not on file  ?Social History Narrative  ? Not on file  ? ?Social Determinants of Health  ? ?Financial Resource Strain: Not on file  ?Food Insecurity: Not on file  ?Transportation Needs: Not on file  ?Physical Activity: Not on file  ?Stress: Not on file  ?Social Connections: Not on file  ? ?Additional Social History: ?  ? ?Allergies:  No Known Allergies ? ?Labs:  ?Results for orders placed or performed during the hospital encounter of 07/25/21 (from the past 48 hour(s))  ?Resp Panel by RT-PCR (Flu A&B, Covid) Nasopharyngeal Swab     Status: None  ? Collection Time: 07/25/21  3:21 AM  ? Specimen: Nasopharyngeal Swab; Nasopharyngeal(NP) swabs in vial transport medium  ?Result Value Ref Range  ? SARS Coronavirus 2 by RT PCR NEGATIVE NEGATIVE  ?  Comment: (NOTE) ?SARS-CoV-2 target nucleic acids are NOT DETECTED. ? ?The SARS-CoV-2 RNA is generally detectable in upper respiratory ?specimens during the acute phase of infection. The lowest ?concentration of SARS-CoV-2 viral copies this assay can detect is ?138 copies/mL. A negative result does not preclude SARS-Cov-2 ?infection and should not be used as the sole basis for treatment or ?other patient management decisions. A negative result may occur with  ?improper specimen collection/handling, submission of specimen other ?than nasopharyngeal swab, presence of viral mutation(s) within the ?areas targeted by this assay, and inadequate number of viral ?copies(<138 copies/mL). A negative result must be combined with ?clinical observations, patient history, and epidemiological ?information. The expected result is Negative. ? ?Fact Sheet for Patients:  ?EntrepreneurPulse.com.au ? ?Fact Sheet for  Healthcare Providers:  ?IncredibleEmployment.be ? ?This test is no t yet approved or cleared by the [place] FDA and  ?has been authorized for detection and/or diagnosis of SARS-CoV-2 by ?FDA under an Emergency Use Authorization (EUA). This EUA will remain  ?in effect (meaning this test can be used) for the duration of the ?COVID-19 declaration under Section 564(b)(1) of the Act, 21 ?U.S.C.section 360bbb-3(b)(1), unless the authorization is terminated  ?or revoked sooner.  ? ? ?  ? Influenza A by PCR NEGATIVE NEGATIVE  ? Influenza B by PCR NEGATIVE NEGATIVE  ?  Comment: (NOTE) ?The Xpert Xpress SARS-CoV-2/FLU/RSV plus assay is intended as an aid ?in the diagnosis of influenza from Nasopharyngeal swab specimens and ?should not be used as a sole basis for treatment. Nasal washings and ?aspirates are unacceptable for Xpert Xpress SARS-CoV-2/FLU/RSV ?testing. ? ?Fact Sheet for Patients: ?EntrepreneurPulse.com.au ? ?Fact Sheet for Healthcare Providers: ?IncredibleEmployment.be ? ?This test is not yet approved or cleared by the [place] and ?has  been authorized for detection and/or diagnosis of SARS-CoV-2 by ?FDA under an Emergency Use Authorization (EUA). This EUA will remain ?in effect (meaning this test can be used) for the duration of the ?COVID-19 declaration under Section 564(b)(1) of the Act, 21 U.S.C. ?section 360bbb-3(b)(1), unless the authorization is terminated or ?revoked. ? ?Performed at [facility], [place], ?[place] [address] ?  ?Ethanol     Status: Abnormal  ? Collection Time: 07/25/21  3:21 AM  ?Result Value Ref Range  ? Alcohol, Ethyl (B) 94 (H) <10 mg/dL  ?  Comment: (NOTE) ?Lowest detectable limit for serum alcohol is 10 mg/dL. ? ?For medical purposes only. ?Performed at [facility], [place], ?[place] [address] ?  ?Acetaminophen level     Status: Abnormal  ? Collection Time: 07/25/21   3:21 AM  ?Result Value Ref Range  ? Acetaminophen (Tylenol), Serum <10 (L) 10 - 30 ug/mL  ?  Comment: (NOTE) ?Therapeutic concentrations vary significantly. A range of 10-30 ug/mL  ?may be an effective c

## 2021-07-25 NOTE — ED Notes (Signed)
Report to include Situation, Background, Assessment, and Recommendations received from Sonija RN. Patient alert and oriented, warm and dry, in no acute distress. Patient denies SI, HI, AVH and pain. Patient made aware of Q15 minute rounds and security cameras for their safety. Patient instructed to come to me with needs or concerns. ? ?

## 2021-07-25 NOTE — Consult Note (Signed)
[facility]  ? ?Reason for Consult:  alcohol intoxication ?Referring Physician:  EDP ?Patient Identification: Francisco Barrett ?MRN:  [id number] ?Principal Diagnosis: Alcohol-induced mood disorder (HCC) ?Diagnosis:  Principal Problem: ?  Alcohol-induced mood disorder (HCC) ?Active Problems: ?  Tobacco abuse ?  Polysubstance abuse (HCC) ?  Alcohol abuse ?  Cocaine abuse (HCC) ?  Essential hypertension ? ? ?Total Time spent with patient: 45 minutes ? ?Subjective:   ?Francisco Barrett is a 41 y.o. male patient admitted with alcohol intoxication and threatened to run into traffic. ? ?HPI:  41 yo male presented to the ED for physical issues and intoxication after threatening to run into traffic.  Today, he is clear, coherent, and cooperative with no suicidal/homicidal ideations, hallucinations, and withdrawal symptoms, slight tremors on assessment.  He would like to proceed to detox/rehab, TTS is working on this, psychiatrically cleared. ? ?Past Psychiatric History: substance abuse ? ?Risk to Self:  none ?Risk to Others:  none ?Prior Inpatient Therapy:  several detox/rehab ?Prior Outpatient Therapy:  yes ? ?Past Medical History:  ?Past Medical History:  ?Diagnosis Date  ? H/O ETOH abuse   ? Tobacco abuse   ? No past surgical history on file. ?Family History: No family history on file. ?Family Psychiatric  History: none ?Social History:  ?Social History  ? ?Substance and Sexual Activity  ?Alcohol Use Yes  ? Comment: 2 40's  ?   ?Social History  ? ?Substance and Sexual Activity  ?Drug Use Yes  ? Types: Marijuana, Cocaine  ?  ?Social History  ? ?Socioeconomic History  ? Marital status: Single  ?  Spouse name: Not on file  ? Number of children: Not on file  ? Years of education: Not on file  ? Highest education level: Not on file  ?Occupational History  ? Not on file  ?Tobacco Use  ? Smoking status: Every Day  ?  Packs/day: 0.50  ?  Types: Cigarettes  ? Smokeless tobacco: Never   ?Vaping Use  ? Vaping Use: Never used  ?Substance and Sexual Activity  ? Alcohol use: Yes  ?  Comment: 2 40's  ? Drug use: Yes  ?  Types: Marijuana, Cocaine  ? Sexual activity: Not on file  ?Other Topics Concern  ? Not on file  ?Social History Narrative  ? Not on file  ? ?Social Determinants of Health  ? ?Financial Resource Strain: Not on file  ?Food Insecurity: Not on file  ?Transportation Needs: Not on file  ?Physical Activity: Not on file  ?Stress: Not on file  ?Social Connections: Not on file  ? ?Additional Social History: ?  ? ?Allergies:  No Known Allergies ? ?Labs:  ?Results for orders placed or performed during the hospital encounter of 07/25/21 (from the past 48 hour(s))  ?Resp Panel by RT-PCR (Flu A&B, Covid) Nasopharyngeal Swab     Status: None  ? Collection Time: 07/25/21  3:21 AM  ? Specimen: Nasopharyngeal Swab; Nasopharyngeal(NP) swabs in vial transport medium  ?Result Value Ref Range  ? SARS Coronavirus 2 by RT PCR NEGATIVE NEGATIVE  ?  Comment: (NOTE) ?SARS-CoV-2 target nucleic acids are NOT DETECTED. ? ?The SARS-CoV-2 RNA is generally detectable in upper respiratory ?specimens during the acute phase of infection. The lowest ?concentration of SARS-CoV-2 viral copies this assay can detect is ?138 copies/mL. A negative result does not preclude SARS-Cov-2 ?infection and should not be used as the sole basis for treatment or ?other patient management decisions. A negative result may  occur with  ?improper specimen collection/handling, submission of specimen other ?than nasopharyngeal swab, presence of viral mutation(s) within the ?areas targeted by this assay, and inadequate number of viral ?copies(<138 copies/mL). A negative result must be combined with ?clinical observations, patient history, and epidemiological ?information. The expected result is Negative. ? ?Fact Sheet for Patients:  ?BloggerCourse.com ? ?Fact Sheet for Healthcare Providers:   ?SeriousBroker.it ? ?This test is no t yet approved or cleared by the [place] FDA and  ?has been authorized for detection and/or diagnosis of SARS-CoV-2 by ?FDA under an Emergency Use Authorization (EUA). This EUA will remain  ?in effect (meaning this test can be used) for the duration of the ?COVID-19 declaration under Section 564(b)(1) of the Act, 21 ?U.S.C.section 360bbb-3(b)(1), unless the authorization is terminated  ?or revoked sooner.  ? ? ?  ? Influenza A by PCR NEGATIVE NEGATIVE  ? Influenza B by PCR NEGATIVE NEGATIVE  ?  Comment: (NOTE) ?The Xpert Xpress SARS-CoV-2/FLU/RSV plus assay is intended as an aid ?in the diagnosis of influenza from Nasopharyngeal swab specimens and ?should not be used as a sole basis for treatment. Nasal washings and ?aspirates are unacceptable for Xpert Xpress SARS-CoV-2/FLU/RSV ?testing. ? ?Fact Sheet for Patients: ?BloggerCourse.com ? ?Fact Sheet for Healthcare Providers: ?SeriousBroker.it ? ?This test is not yet approved or cleared by the [place] FDA and ?has been authorized for detection and/or diagnosis of SARS-CoV-2 by ?FDA under an Emergency Use Authorization (EUA). This EUA will remain ?in effect (meaning this test can be used) for the duration of the ?COVID-19 declaration under Section 564(b)(1) of the Act, 21 U.S.C. ?section 360bbb-3(b)(1), unless the authorization is terminated or ?revoked. ? ?Performed at [facility], [address] [place], ?[place] [address] ?  ?Ethanol     Status: Abnormal  ? Collection Time: 07/25/21  3:21 AM  ?Result Value Ref Range  ? Alcohol, Ethyl (B) 94 (H) <10 mg/dL  ?  Comment: (NOTE) ?Lowest detectable limit for serum alcohol is 10 mg/dL. ? ?For medical purposes only. ?Performed at [facility], [address] [place], ?[place] [address] ?  ?Acetaminophen level     Status: Abnormal  ? Collection Time: 07/25/21  3:21 AM  ?Result  Value Ref Range  ? Acetaminophen (Tylenol), Serum <10 (L) 10 - 30 ug/mL  ?  Comment: (NOTE) ?Therapeutic concentrations vary significantly. A range of 10-30 ug/mL  ?may be an effective concentration for many patients. However, some  ?are best treated at concentrations outside of this range. ?Acetaminophen concentrations >150 ug/mL at 4 hours after ingestion  ?and >50 ug/mL at 12 hours after ingestion are often associated with  ?toxic reactions. ? ?Performed at [facility], [address] [place], ?[place] [address] ?  ?Salicylate level     Status: Abnormal  ? Collection Time: 07/25/21  3:21 AM  ?Result Value Ref Range  ? Salicylate Lvl <7.0 (L) 7.0 - 30.0 mg/dL  ?  Comment: Performed at [facility], [address], [place], [place] [address]  ? ? ?No current facility-administered medications for this encounter.  ? ?Current Outpatient Medications  ?Medication Sig Dispense Refill  ? amLODipine (NORVASC) 5 MG tablet Take 2 tablets (10 mg total) by mouth once daily. (Patient not taking: Reported on 07/05/2021) 60 tablet 1  ? hydrOXYzine (ATARAX) 50 MG tablet Take 1 tablet (50 mg total) by mouth once every 6 (six) hours as needed for anxiety. (Patient not taking: Reported on 07/05/2021) 60 tablet 1  ? mirtazapine (  REMERON) 30 MG tablet Take 1 tablet (30 mg total) by mouth once daily at bedtime. (Patient not taking: Reported on 07/05/2021) 30 tablet 1  ? nicotine (NICODERM CQ - DOSED IN MG/24 HOURS) 21 mg/24hr patch Place 1 patch (21 mg total) onto the skin daily. (Patient not taking: Reported on 07/05/2021) 28 patch 1  ? ? ?Musculoskeletal: ?Strength & Muscle Tone: within normal limits ?Gait & Station: normal ?Patient leans: N/A ? ?Psychiatric Specialty Exam: ?Physical Exam ?Vitals and nursing note reviewed.  ?Constitutional:   ?   Appearance: Normal appearance.  ?HENT:  ?   Head: Normocephalic.  ?   Nose: Nose normal.  ?Pulmonary:  ?   Effort: Pulmonary effort is normal.  ?Musculoskeletal:     ?    General: Normal range of motion.  ?   Cervical back: Normal range of motion.  ?Neurological:  ?   General: No focal deficit present.  ?   Mental Status: He is alert and oriented to person, place, and time.  ?Psychiatric:     ?   Attention and Perc

## 2021-07-25 NOTE — Discharge Instructions (Addendum)
Steps to find a Primary Care Provider (PCP):  Call [phone] or [phone] to access "[facility] Find a Doctor Service."  2.  You may also go on the [facility] website at [email or web address]  

## 2021-07-25 NOTE — ED Notes (Signed)
IVC/pt awaiting treatment referral and reccomendations ?

## 2021-07-25 NOTE — ED Notes (Addendum)
Pt dressed out with this RN, EDT Idalia Needle), Security officer Margo Aye) belongings include: ? ?1 Black watch  ?1 pair of Black shoes ?1 Black hat  ?1 Black shirt ?1 Black belt ?1 pair of Blue underwear ?1 pair of Black socks ?1 pair of Blue jeans ?1 black coat ?1 black bookbag ? ?
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
## 2021-07-25 NOTE — BH Assessment (Signed)
Writer spoke with the patient to complete an updated/reassessment. Patient denies SI/HI and AV/H. Spoke with him about going to SA Treatment and he is in agreement with the plan. ?

## 2021-07-25 NOTE — ED Notes (Signed)
IVC/Consult Completed/ Plan to observe & Re-assess in Am ?

## 2021-07-25 NOTE — ED Notes (Signed)
Pt given breakfast tray

## 2021-07-25 NOTE — BH Assessment (Signed)
Referral information for Inpatient Detox and SA Treatment faxed to;  ? ?[facility] [phone]) ? ?[facility] [phone]), ? ?[facility] [phone]) ? ?[facility] [phone] or [phone]) ? ?[facility] [phone] -or- [phone]),  ? ?[facility] [phone]) ?

## 2021-07-25 NOTE — ED Notes (Signed)
Pt did not come over from [place] with dinner tray and there were no extra trays in [place]. Called dietary about issue and was sent a new tray. Pt did receive dinner tray  ?

## 2021-07-25 NOTE — ED Notes (Signed)
Pt lying down watching tv ?

## 2021-07-25 NOTE — ED Notes (Addendum)
Dr Elesa Massed to triage to examine pt ?

## 2021-07-25 NOTE — ED Provider Notes (Signed)
? ?[facility] ?Provider Note ? ? ? Event Date/Time  ? First MD Initiated Contact with Patient 07/25/21 0107   ?  (approximate) ? ? ?History  ? ?Pain ? ? ?HPI ? ?Francisco Barrett is a 41 y.o. male with history of substance abuse, homelessness, depression who presents to the emergency department with multiple complaints.  Patient is well-known to our emergency department and has had 9 ER visits in the past 6 months.  He is complaining of chronic left distal lower extremity pain that has been ongoing for years.  He is cursing and asking for [place] to just "cut the damn thing off".  He denies any injury. ? ?He also states that he is hearing things.  When asked him to elaborate further he states that he prefers not to.  He tells me that he is not suicidal and he is not going to harm himself. ? ?He also states that his blood pressure is elevated but is currently 123/95. ? ?He states "I am tired of coming to the emergency department and y'all not doing a damn thing".  He states he is homeless and cannot follow-up with a primary care doctor. ? ? ?History provided by patient. ? ? ? ?Past Medical History:  ?Diagnosis Date  ? H/O ETOH abuse   ? Tobacco abuse   ? ? ?History reviewed. No pertinent surgical history. ? ?MEDICATIONS:  ?Prior to Admission medications   ?Medication Sig Start Date End Date Taking? Authorizing Provider  ?amLODipine (NORVASC) 5 MG tablet Take 2 tablets (10 mg total) by mouth once daily. ?Patient not taking: Reported on 07/05/2021 06/26/21   Willy Eddy, MD  ?hydrOXYzine (ATARAX) 50 MG tablet Take 1 tablet (50 mg total) by mouth once every 6 (six) hours as needed for anxiety. ?Patient not taking: Reported on 07/05/2021 06/26/21   Willy Eddy, MD  ?mirtazapine (REMERON) 30 MG tablet Take 1 tablet (30 mg total) by mouth once daily at bedtime. ?Patient not taking: Reported on 07/05/2021 06/26/21   Willy Eddy, MD  ?nicotine (NICODERM CQ - DOSED IN MG/24 HOURS) 21 mg/24hr  patch Place 1 patch (21 mg total) onto the skin daily. ?Patient not taking: Reported on 07/05/2021 06/24/21   Clapacs, Jackquline Denmark, MD  ? ? ?Physical Exam  ? ?Triage Vital Signs: ?ED Triage Vitals  ?Enc Vitals Group  ?   BP 07/25/21 0005 (!) 123/95  ?   Pulse Rate 07/25/21 0005 79  ?   Resp 07/25/21 0005 17  ?   Temp 07/25/21 0005 98.1 ?F (36.7 ?C)  ?   Temp Source 07/25/21 0005 Oral  ?   SpO2 07/25/21 0005 96 %  ?   Weight 07/25/21 0000 132 lb 0.9 oz (59.9 kg)  ?   Height 07/25/21 0000 5\' 3"  (1.6 m)  ?   Head Circumference --   ?   Peak Flow --   ?   Pain Score 07/25/21 0000 10  ?   Pain Loc --   ?   Pain Edu? --   ?   Excl. in GC? --   ? ? ?Most recent vital signs: ?Vitals:  ? 07/25/21 0005  ?BP: (!) 123/95  ?Pulse: 79  ?Resp: 17  ?Temp: 98.1 ?F (36.7 ?C)  ?SpO2: 96%  ? ? ?CONSTITUTIONAL: Alert and oriented and responds appropriately to questions. Well-appearing; well-nourished, disheveled ?HEAD: Normocephalic, atraumatic ?EYES: Conjunctivae clear, pupils appear equal, sclera nonicteric ?ENT: normal nose; moist mucous membranes ?NECK: Supple, normal ROM ?CARD:  RRR; S1 and S2 appreciated; no murmurs, no clicks, no rubs, no gallops ?RESP: Normal chest excursion without splinting or tachypnea; breath sounds clear and equal bilaterally; no wheezes, no rhonchi, no rales, no hypoxia or respiratory distress, speaking full sentences ?ABD/GI: Normal bowel sounds; non-distended; soft, non-tender, no rebound, no guarding, no peritoneal signs ?BACK: The back appears normal ?EXT: Normal ROM in all joints; no deformity noted, no edema; no cyanosis, no calf tenderness or calf swelling.  No joint effusion in the left lower extremity.  Compartments soft in the left leg.  2+ left DP pulse.  No rash, redness, warmth.  Ambulates without difficulty. ?SKIN: Normal color for age and race; warm; no rash on exposed skin ?NEURO: Moves all extremities equally, normal speech ?PSYCH: The patient's mood and manner are appropriate. Agitated.  Does not  appear to be responding to internal stimuli.  Denies SI. ? ? ?ED Results / Procedures / Treatments  ? ?LABS: ?(all labs ordered are listed, but only abnormal results are displayed) ?Labs Reviewed - No data to display ? ? ?EKG: ? ? ?RADIOLOGY: ?My personal review and interpretation of imaging:   ? ?I have personally reviewed all radiology reports.   ?No results found. ? ? ?PROCEDURES: ? ?Critical Care performed: No ? ? ?CRITICAL CARE ?Performed by: Baxter Hire Avah Bashor ? ? ?Total critical care time: 0 minutes ? ?Critical care time was exclusive of separately billable procedures and treating other patients. ? ?Critical care was necessary to treat or prevent imminent or life-threatening deterioration. ? ?Critical care was time spent personally by me on the following activities: development of treatment plan with patient and/or surrogate as well as nursing, discussions with consultants, evaluation of patient's response to treatment, examination of patient, obtaining history from patient or surrogate, ordering and performing treatments and interventions, ordering and review of laboratory studies, ordering and review of radiographic studies, pulse oximetry and re-evaluation of patient's condition. ? ? ?Procedures ? ? ? ?IMPRESSION / MDM / ASSESSMENT AND PLAN / ED COURSE  ?I reviewed the triage vital signs and the nursing notes. ? ? ? ?Patient here with multiple complaints that seem to be chronic in nature. ? ? ? ? ?DIFFERENTIAL DIAGNOSIS (includes but not limited to):   Malingering, homelessness, chronic pain, psychiatric illness ? ? ?PLAN: Patient here frequently for similar complaints.  At this time he denies SI and does not appear to be responding to internal stimuli and refuses to give any further information on what he is hearing.  I do not feel at this time he needs emergent psychiatric evaluation.  I suspect there is some component of malingering due to his homelessness. ? ?As for his left leg pain, this is a chronic issue  for him.  There is no new injury.  No sign of fracture, dislocation, septic arthritis, gout, compartment syndrome, DVT, arterial obstruction on exam.  Have recommended over-the-counter Tylenol and Motrin for pain control and to follow-up with a primary care doctor for further management.  Patient is requesting and adamant that we amputate his leg in the emergency department.  Have explained to him that this is not indicated and would not be done in the ED. ? ? ?Patient also states he is concerned about his blood pressure however it is 123/95 at this time. ? ? ?MEDICATIONS GIVEN IN ED: ?Medications - No data to display ? ? ?ED COURSE:  At this time, I do not feel there is any life-threatening condition present. I reviewed all nursing notes, vitals, pertinent  previous records.  All lab and urine results, EKGs, imaging ordered have been independently reviewed and interpreted by myself.  I reviewed all available radiology reports from any imaging ordered this visit.  Based on my assessment, I feel the patient is safe to be discharged home without further emergent workup and can continue workup as an outpatient as needed. Discussed all findings, treatment plan as well as usual and customary return precautions with patient.  They verbalize understanding and are comfortable with this plan.  Outpatient follow-up has been provided as needed.  All questions have been answered. ? ? ? ?CONSULTS: No consult needed at this time given no emergency present. ? ? ?OUTSIDE RECORDS REVIEWED: Reviewed patient's most recent behavioral health notes in March 2023. ? ? ? ? ? ? ? ? ?FINAL CLINICAL IMPRESSION(S) / ED DIAGNOSES  ? ?Final diagnoses:  ?Homelessness  ?Chronic pain of left lower extremity  ?Auditory hallucinations  ? ? ? ?Rx / DC Orders  ? ?ED Discharge Orders   ? ? None  ? ?  ? ? ? ?Note:  This document was prepared using Dragon voice recognition software and may include unintentional dictation errors. ?  ?Vernia Teem, Layla MawKristen N,  DO ?07/25/21 0122 ? ?

## 2021-07-25 NOTE — ED Notes (Addendum)
Attempted to give pt's d/c instructions; pt angry, cursing; st "I don't care nothing about your security either! Y'all don't know who I am! Keep fucking with me and I'm gonna get a check!  Latanya Presser put some respect on my name! Pt provides e-signature and throws pen down leaving without instructions ?

## 2021-07-25 NOTE — ED Notes (Signed)
Patient took meds without difficulty. ?

## 2021-07-25 NOTE — ED Notes (Signed)
Pysch and TTS at bedside  ?

## 2021-07-26 NOTE — ED Notes (Signed)
Pt discharged home.  All belongings returned to patient.  Pt denies SI.   ?

## 2021-07-26 NOTE — ED Notes (Addendum)
EDP made aware of pt's elevated BP.  Pt will still be discharged.  PO medication given prior to discharge per EDP.   ?

## 2021-07-26 NOTE — BH Assessment (Signed)
Patient was giving referral information and instructions on how to follow up with Outpatient Treatment ([facility]) and Mobile Crisis. ? ?Writer also provided the patient with the contact information for [employer] Peer Support staff member Lorella Nimrod B.) ? ?Patient denies SI/HI and AV/H. ? ?

## 2021-07-26 NOTE — BH Assessment (Signed)
Writer referral information for Inpatient Detox and SA Treatment faxed to;  ?  ?[facility] ([place]), pt. Declined. ? ?[facility] ([phone]), no detox beds. ? ?[facility] [phone]), left a message requesting return phone call. ?  ?[facility] [phone]), unable to reach anyone. ? ?[facility] ([phone] or [phone]), no beds. ? ?[facility] [phone] -or- [phone]), Denied due to no insurance. ? ?[facility] [phone]), Patient does not have insurance. ?

## 2021-07-26 NOTE — BH Assessment (Addendum)
Writer contacted [facility] to inquire detox beds, facility reports that male beds are available but to contact back after 8:30am and speak with the nurse Loraine Leriche. ? ?Writer contacted [facility] and spoke with Genella Rife who reports no beds available tonight and to try back later today 07/26/21 ?

## 2021-07-26 NOTE — ED Provider Notes (Signed)
----------------------------------------- ?  5:32 PM on 07/26/2021 ?----------------------------------------- ?Patient has been seen and evaluated by psychiatry.  They believe the patient safe for discharge home from a psychiatric standpoint.  They were unable to place the patient into a detox facility but have provided outpatient resources.  Medical work-up largely nonrevealing besides a mild alcohol elevation. ?  ?Harvest Dark, MD ?07/26/21 1732 ? ?

## 2021-07-26 NOTE — ED Notes (Signed)
Patient resting quietly in room. No noted distress or abnormal behaviors noted. Will continue 15 minute checks and observation by security camera for safety. 

## 2021-07-26 NOTE — ED Provider Notes (Signed)
Emergency Medicine Observation Re-evaluation Note ? ?Francisco Barrett is a 41 y.o. male, seen on rounds today.  Pt initially presented to the ED for complaints of IVC ? ?Currently, the patient is resting in bed  ? ?Physical Exam  ?Blood pressure (!) 140/96, pulse 66, temperature 99.2 ?F (37.3 ?C), temperature source Oral, resp. rate 18, SpO2 99 %. ? ?Physical Exam ?General: No apparent distress ?Resp: normal WOB ?Neuro: asleep ?   ? ?ED Course / MDM  ?  ? ?I have reviewed the labs performed to date as well as medications administered while in observation.  Recent changes in the last 24 hours include none.  Initially etoh slightly elevated. ? ?Plan  ? ?Current plan is to continue to wait for placement. ?Patient is not under full IVC at this time. ?  ?Concha Se, MD ?07/26/21 [phone] ? ?

## 2021-07-30 ENCOUNTER — Emergency Department
Admission: EM | Admit: 2021-07-30 | Discharge: 2021-07-30 | Disposition: A | Discharge status: Home / Self Care | Payer: Self-pay | Attending: Emergency Medicine | Admitting: Emergency Medicine

## 2021-07-30 ENCOUNTER — Other Ambulatory Visit: Payer: Self-pay

## 2021-07-30 ENCOUNTER — Encounter: Payer: Self-pay | Admitting: Emergency Medicine

## 2021-07-30 DIAGNOSIS — F1490 Cocaine use, unspecified, uncomplicated: Secondary | ICD-10-CM | POA: Insufficient documentation

## 2021-07-30 DIAGNOSIS — F17200 Nicotine dependence, unspecified, uncomplicated: Secondary | ICD-10-CM | POA: Insufficient documentation

## 2021-07-30 DIAGNOSIS — Y900 Blood alcohol level of less than 20 mg/100 ml: Secondary | ICD-10-CM | POA: Insufficient documentation

## 2021-07-30 DIAGNOSIS — I10 Essential (primary) hypertension: Secondary | ICD-10-CM | POA: Insufficient documentation

## 2021-07-30 DIAGNOSIS — F1290 Cannabis use, unspecified, uncomplicated: Secondary | ICD-10-CM | POA: Insufficient documentation

## 2021-07-30 DIAGNOSIS — F10220 Alcohol dependence with intoxication, uncomplicated: Secondary | ICD-10-CM | POA: Insufficient documentation

## 2021-07-30 LAB — CBC
HCT: 38.7 % — ABNORMAL LOW (ref 39.0–52.0)
Hemoglobin: 13.6 g/dL (ref 13.0–17.0)
MCH: 32.8 pg (ref 26.0–34.0)
MCHC: 35.1 g/dL (ref 30.0–36.0)
MCV: 93.3 fL (ref 80.0–100.0)
Platelets: 274 10*3/uL (ref 150–400)
RBC: 4.15 MIL/uL — ABNORMAL LOW (ref 4.22–5.81)
RDW: 12.5 % (ref 11.5–15.5)
WBC: 7.2 10*3/uL (ref 4.0–10.5)
nRBC: 0 % (ref 0.0–0.2)

## 2021-07-30 LAB — COMPREHENSIVE METABOLIC PANEL
ALT: 23 U/L (ref 0–44)
AST: 26 U/L (ref 15–41)
Albumin: 4.5 g/dL (ref 3.5–5.0)
Alkaline Phosphatase: 60 U/L (ref 38–126)
Anion gap: 9 (ref 5–15)
BUN: 14 mg/dL (ref 6–20)
CO2: 28 mmol/L (ref 22–32)
Calcium: 9.3 mg/dL (ref 8.9–10.3)
Chloride: 102 mmol/L (ref 98–111)
Creatinine, Ser: 0.64 mg/dL (ref 0.61–1.24)
GFR, Estimated: >60 mL/min (ref >60)
Glucose, Bld: 105 mg/dL — ABNORMAL HIGH (ref 70–99)
Potassium: 3.4 mmol/L — ABNORMAL LOW (ref 3.5–5.1)
Sodium: 139 mmol/L (ref 135–145)
Total Bilirubin: 1 mg/dL (ref 0.3–1.2)
Total Protein: 8 g/dL (ref 6.5–8.1)

## 2021-07-30 LAB — URINE DRUG SCREEN, QUALITATIVE (ARMC ONLY)
Amphetamines, Ur Screen: NOT DETECTED
Barbiturates, Ur Screen: NOT DETECTED
Benzodiazepine, Ur Scrn: NOT DETECTED
Cannabinoid 50 Ng, Ur ~~LOC~~: POSITIVE — AB
Cocaine Metabolite,Ur ~~LOC~~: POSITIVE — AB
MDMA (Ecstasy)Ur Screen: NOT DETECTED
Methadone Scn, Ur: NOT DETECTED
Opiate, Ur Screen: NOT DETECTED
Phencyclidine (PCP) Ur S: NOT DETECTED
Tricyclic, Ur Screen: NOT DETECTED

## 2021-07-30 LAB — LIPASE, BLOOD: Lipase: 27 U/L (ref 11–51)

## 2021-07-30 LAB — ETHANOL: Alcohol, Ethyl (B): <10 mg/dL (ref <10)

## 2021-07-30 MED ORDER — LORAZEPAM 2 MG PO TABS: 0.0000 mg | ORAL_TABLET | Freq: Four times a day (QID) | ORAL | Status: DC

## 2021-07-30 MED ORDER — ACETAMINOPHEN 325 MG PO TABS
650.0000 mg | ORAL_TABLET | Freq: Once | ORAL | Status: AC
Start: 2021-07-30 — End: 2021-07-30

## 2021-07-30 MED ORDER — THIAMINE HCL 100 MG PO TABS
100.0000 mg | ORAL_TABLET | Freq: Every day | ORAL | Status: DC
Start: 2021-07-30 — End: 2021-07-30
  Administered 2021-07-30: 100 mg via ORAL
  Filled 2021-07-30: qty 1

## 2021-07-30 MED ORDER — ACETAMINOPHEN 325 MG PO TABS
ORAL_TABLET | ORAL | Status: AC
Start: 2021-07-30 — End: 2021-07-30
  Administered 2021-07-30: 650 mg via ORAL
  Filled 2021-07-30: qty 2

## 2021-07-30 NOTE — BH Assessment (Signed)
Patient has been accepted to [facility] this afternoon 07/30/21 at 1:00pm.  ?Representative was [place].  ? ?ER Staff is aware of it:  ?Lattie Haw, Primary school teacher  ?Dr. Gardenia Phlegm, ER MD  ?Radonna Ricker, Patient's Nurse ?    ?** Patient will be picked up by [facility] and must be in the ER waiting room at 1:00pm. ** ?

## 2021-07-30 NOTE — ED Provider Notes (Signed)
? ?[facility] ?Provider Note ? ? ? Event Date/Time  ? First MD Initiated Contact with Patient 07/30/21 0335   ?  (approximate) ? ? ?History  ? ?Drug / Alcohol Assessment ? ? ?HPI ? ?Francisco Barrett is a 41 y.o. male well-known to the emergency department who presents to the ED for detox assistance from alcohol.  Denies active SI/HI/AH/VH.  Voices no medical complaints. ?  ? ? ?Past Medical History  ? ?Past Medical History:  ?Diagnosis Date  ? H/O ETOH abuse   ? Tobacco abuse   ? ? ? ?Active Problem List  ? ?Patient Active Problem List  ? Diagnosis Date Noted  ? Alcohol-induced mood disorder (HCC)   ? Alcohol abuse 06/19/2021  ? Cocaine abuse (HCC) 06/19/2021  ? Essential hypertension 06/19/2021  ? Tobacco abuse   ? Polysubstance abuse (HCC)   ? Leukocytosis   ? Acute respiratory failure with hypoxia (HCC) 10/06/2020  ? ? ? ?Past Surgical History  ?History reviewed. No pertinent surgical history. ? ? ?Home Medications  ? ?Prior to Admission medications   ?Medication Sig Start Date End Date Taking? Authorizing Provider  ?amLODipine (NORVASC) 5 MG tablet Take 2 tablets (10 mg total) by mouth once daily. ?Patient not taking: Reported on 07/05/2021 06/26/21   Willy Eddy, MD  ?hydrOXYzine (ATARAX) 50 MG tablet Take 1 tablet (50 mg total) by mouth once every 6 (six) hours as needed for anxiety. ?Patient not taking: Reported on 07/05/2021 06/26/21   Willy Eddy, MD  ?mirtazapine (REMERON) 30 MG tablet Take 1 tablet (30 mg total) by mouth once daily at bedtime. ?Patient not taking: Reported on 07/05/2021 06/26/21   Willy Eddy, MD  ?nicotine (NICODERM CQ - DOSED IN MG/24 HOURS) 21 mg/24hr patch Place 1 patch (21 mg total) onto the skin daily. ?Patient not taking: Reported on 07/05/2021 06/24/21   Clapacs, Jackquline Denmark, MD  ? ? ? ?Allergies  ?Patient has no known allergies. ? ? ?Family History  ?No family history on file. ? ? ?Physical Exam  ?Triage Vital Signs: ?ED Triage Vitals  ?Enc  Vitals Group  ?   BP 07/30/21 0036 (!) 144/102  ?   Pulse Rate 07/30/21 0036 68  ?   Resp 07/30/21 0036 20  ?   Temp 07/30/21 0036 98.7 ?F (37.1 ?C)  ?   Temp Source 07/30/21 0036 Oral  ?   SpO2 07/30/21 0036 100 %  ?   Weight 07/30/21 0040 132 lb 0.9 oz (59.9 kg)  ?   Height 07/30/21 0040 5\' 3"  (1.6 m)  ?   Head Circumference --   ?   Peak Flow --   ?   Pain Score 07/30/21 0040 8  ?   Pain Loc --   ?   Pain Edu? --   ?   Excl. in GC? --   ? ? ?Updated Vital Signs: ?BP (!) 144/102 (BP Location: Right Arm)   Pulse 68   Temp 98.7 ?F (37.1 ?C) (Oral)   Resp 20   Ht 5\' 3"  (1.6 m)   Wt 59.9 kg   SpO2 100%   BMI 23.39 kg/m?  ? ? ?General: Awake, no distress.  ?CV:  RRR good peripheral perfusion.  ?Resp:  Normal effort.  CTA B. ?Abd:  Nontender.  No distention.  ?Other:  Not agitated. ? ? ?ED Results / Procedures / Treatments  ?Labs ?(all labs ordered are listed, but only abnormal results are displayed) ?Labs Reviewed  ?CBC -  Abnormal; Notable for the following components:  ?    Result Value  ? RBC 4.15 (*)   ? HCT 38.7 (*)   ? All other components within normal limits  ?COMPREHENSIVE METABOLIC PANEL - Abnormal; Notable for the following components:  ? Potassium 3.4 (*)   ? Glucose, Bld 105 (*)   ? All other components within normal limits  ?URINE DRUG SCREEN, QUALITATIVE ([facility] ONLY) - Abnormal; Notable for the following components:  ? Cocaine Metabolite,Ur [place] POSITIVE (*)   ? Cannabinoid 50 Ng, Ur [place] POSITIVE (*)   ? All other components within normal limits  ?LIPASE, BLOOD  ?ETHANOL  ? ? ? ?EKG ? ?None ? ? ?RADIOLOGY ?None ? ? ?Official radiology report(s): ?No results found. ? ? ?PROCEDURES: ? ?Critical Care performed: No ? ?Procedures ? ? ?MEDICATIONS ORDERED IN ED: ?Medications  ?LORazepam (ATIVAN) tablet 0-4 mg (has no administration in time range)  ?thiamine tablet 100 mg (has no administration in time range)  ? ? ? ?IMPRESSION / MDM / ASSESSMENT AND PLAN / ED COURSE  ?I reviewed the triage vital signs and  the nursing notes. ?             ?               ?41 year old male requesting detox from alcohol.  Will consult TTS to evaluate patient in the ED.  Will place on CIWA protocol. ? ?FINAL CLINICAL IMPRESSION(S) / ED DIAGNOSES  ? ?Final diagnoses:  ?Alcohol dependence with uncomplicated intoxication (HCC)  ?Cocaine use  ?Marijuana use  ? ? ? ?Rx / DC Orders  ? ?ED Discharge Orders   ? ? None  ? ?  ? ? ? ?Note:  This document was prepared using Dragon voice recognition software and may include unintentional dictation errors. ?  ?Irean Hong, MD ?07/30/21 [phone] ? ?

## 2021-07-30 NOTE — ED Provider Notes (Signed)
Patient accepted to go to [facility] for alcohol abuse treatment.  He will be picked up at 1 PM from the ED lobby.  Discharged in stable condition. ?  ?Gilles Chiquito, MD ?07/30/21 1005 ? ?

## 2021-07-30 NOTE — BH Assessment (Signed)
Comprehensive Clinical Assessment (CCA) Note ? ?07/30/2021 ?Francisco Barrett ?[id number] ? ?Chief Complaint: Patient is a 41 year old male presenting [facility] ED voluntarily. Per triage note Patient ambulatory to triage with steady gait, without difficulty or distress noted; pt reports here for detox assistance; denies any other c/o or injuries. During assessment patient appears alert and oriented x4, calm and cooperative. Patient reports using both Cocaine and Alcohol. Patient reports using "100 grams" of Cocaine via smoking. Patient reports last use on 07/29/21. Patient reports alcohol use, he reports drinking 1 40oz beer yesterday and at most drinking 1 24 pack of beer. Patient reports withdrawal symptoms "some shaking." Patient denies SI/HI/AH/VH. ?Chief Complaint  ?Patient presents with  ? Drug / Alcohol Assessment  ? ?Visit Diagnosis: Alcohol use disorder, severe. Stimulant-use disorder, severe  ? ? ?CCA Screening, Triage and Referral (STR) ? ?Patient Reported Information ?How did you hear about us? Self ? ?Referral name: No data recorded ?Referral phone number: No data recorded ? ?Whom do you see for routine medical problems? No data recorded ?Practice/Facility Name: No data recorded ?Practice/Facility Phone Number: No data recorded ?Name of Contact: No data recorded ?Contact Number: No data recorded ?Contact Fax Number: No data recorded ?Prescriber Name: No data recorded ?Prescriber Address (if known): No data recorded ? ?What Is the Reason for Your Visit/Call Today? Patient presents voluntarily requesting detox ? ?How Long Has This Been Causing You Problems? > than 6 months ? ?What Do You Feel Would Help You the Most Today? Alcohol or Drug Use Treatment ? ? ?Have You Recently Been in Any Inpatient Treatment (Hospital/Detox/Crisis Center/28-Day Program)? No data recorded ?Name/Location of Program/Hospital:No data recorded ?How Long Were You There? No data recorded ?When Were You Discharged? No data  recorded ? ?Have You [facility]? No data recorded ?[facility]? No data recorded ? ?Have You Recently Had Any Thoughts About Hurting Yourself? No ? ?Are You Planning to Commit Suicide/Harm Yourself At This time? No ? ? ?Have you Recently Had Thoughts About Hurting Someone Karolee Ohslse? No ? ?Explanation: No data recorded ? ?Have You Used Any Alcohol or Drugs in the Past 24 Hours? Yes ? ?How Long Ago Did You Use Drugs or Alcohol? No data recorded ?What Did You Use and How Much? Alcohol, Cocaine ? ? ?Do You Currently Have a Therapist/Psychiatrist? No ? ?Name of Therapist/Psychiatrist: No data recorded ? ?Have You Been Recently Discharged From Any Office Practice or Programs? No ? ?Explanation of Discharge From Practice/Program: No data recorded ? ?  ?CCA Screening Triage Referral Assessment ?Type of Contact: Face-to-Face ? ?Is this Initial or Reassessment? No data recorded ?Date Telepsych consult ordered in CHL:  No data recorded ?Time Telepsych consult ordered in CHL:  No data recorded ? ?Patient Reported Information Reviewed? No data recorded ?Patient Left Without Being Seen? No data recorded ?Reason for Not Completing Assessment: No data recorded ? ?Collateral Involvement: None provided ? ? ?Does Patient Have a Automotive engineerCourt Appointed Legal Guardian? No data recorded ?Name and Contact of Legal Guardian: No data recorded ?If Minor and Not Living with Parent(s), Who has Custody? n/a ? ?Is CPS involved or ever been involved? Never ? ?Is APS involved or ever been involved? Never ? ? ?Patient Determined To Be At Risk for Harm To Self or Others Based on Review of Patient Reported Information or Presenting Complaint? No ? ?Method: No data recorded ?Availability of Means: No data recorded ?Intent: No data recorded ?  Notification Required: No data recorded ?Additional Information for Danger to Others Potential: No data recorded ?Additional Comments for Danger to Others Potential: No data  recorded ?Are There Guns or Other Weapons in Your Home? No data recorded ?Types of Guns/Weapons: No data recorded ?Are These Weapons Safely Secured?                            No data recorded ?Who Could Verify You Are Able To Have These Secured: No data recorded ?Do You Have any Outstanding Charges, Pending Court Dates, Parole/Probation? No data recorded ?Contacted To Inform of Risk of Harm To Self or Others: No data recorded ? ?[facility] ? ? ?Does Patient Present under Involuntary Commitment? No ? ?IVC Papers Initial File Date: 07/25/21 ? ? ?[place] of Residence: [place] ? ? ?Patient Currently Receiving the Following Services: Not Receiving Services ? ? ?Determination of Need: Emergent (2 hours) ? ? ?Options For Referral: Inpatient Hospitalization ? ? ? ? ?CCA Biopsychosocial ?Intake/Chief Complaint:  No data recorded ?Current Symptoms/Problems: No data recorded ? ?Patient Reported Schizophrenia/Schizoaffective Diagnosis in Past: No ? ? ?Strengths: Pt is able to ask for help. ? ?Preferences: No data recorded ?Abilities: No data recorded ? ?Type of Services Patient Feels are Needed: No data recorded ? ?Initial Clinical Notes/Concerns: No data recorded ? ?Mental Health Symptoms ?Depression:   ?Change in energy/activity; Hopelessness; Sleep (too much or little); Tearfulness; Worthlessness; Difficulty Concentrating ?  ?Duration of Depressive symptoms:  ?Greater than two weeks ?  ?Mania:   ?None ?  ?Anxiety:    ?Difficulty concentrating; Sleep; Worrying; Tension ?  ?Psychosis:   ?None ?  ?Duration of Psychotic symptoms: No data recorded  ?Trauma:   ?N/A ?  ?Obsessions:   ?Cause anxiety; Disrupts routine/functioning; Recurrent & persistent thoughts/impulses/images; Good insight ?  ?Compulsions:   ?Disrupts with routine/functioning; "Driven" to perform behaviors/acts; Intended to reduce stress or prevent another outcome; Intrusive/time consuming; Repeated behaviors/mental acts ?  ?Inattention:    ?None ?  ?Hyperactivity/Impulsivity:   ?None ?  ?Oppositional/Defiant Behaviors:   ?N/A ?  ?Emotional Irregularity:   ?Recurrent suicidal behaviors/gestures/threats; Chronic feelings of emptiness; Potentially harmful impulsivity ?  ?Other Mood/Personality Symptoms:  No data recorded  ? ?Mental Status Exam ?Appearance and self-care  ?Stature:   ?Small ?  ?Weight:   ?Average weight ?  ?Clothing:   ?Disheveled (In scrubs) ?  ?Grooming:   ?Neglected ?  ?Cosmetic use:   ?None ?  ?Posture/gait:   ?Slumped ?  ?Motor activity:   ?Restless ?  ?Sensorium  ?Attention:   ?Inattentive ?  ?Concentration:   ?Normal ?  ?Orientation:   ?Situation; Place; Person; Object ?  ?Recall/memory:   ?Normal ?  ?Affect and Mood  ?Affect:   ?Tearful; Negative; Congruent ?  ?Mood:   ?Depressed; Hopeless; Worthless ?  ?Relating  ?Eye contact:   ?Avoided ?  ?Facial expression:   ?Depressed ?  ?Attitude toward examiner:   ?Cooperative ?  ?Thought and Language  ?Speech flow:  ?Clear and Coherent ?  ?Thought content:   ?Appropriate to Mood and Circumstances ?  ?Preoccupation:   ?None ?  ?Hallucinations:   ?None ?  ?Organization:  No data recorded  ?Executive Functions  ?Fund of Knowledge:   ?Average ?  ?Intelligence:   ?Average ?  ?Abstraction:   ?Normal ?  ?Judgement:   ?Fair ?  ?Reality Testing:   ?Adequate ?  ?Insight:   ?Lacking ?  ?  Decision Making:   ?Impulsive ?  ?Social Functioning  ?Social Maturity:   ?Isolates; Impulsive ?  ?Social Judgement:   ?"Street Smart" ?  ?Stress  ?Stressors:   ?Grief/losses; Relationship; Transitions; Housing; Surveyor, quantity; Work ?  ?Coping Ability:   ?Exhausted ?  ?Skill Deficits:   ?Communication; Decision making ?  ?Supports:   ?Support needed ?  ? ? ?Religion: ?Religion/Spirituality ?Are You A Religious Person?:  (Not assessed) ?How Might This Affect Treatment?: n/a ? ?Leisure/Recreation: ?Leisure / Recreation ?Do You Have Hobbies?: No ? ?Exercise/Diet: ?Exercise/Diet ?Do You Exercise?: No ?Have You Gained or Lost  A Significant Amount of Weight in the Past Six Months?: No ?Do You Follow a Special Diet?: No ?Do You Have Any Trouble Sleeping?: Yes ?Explanation of Sleeping Difficulties: Patient reports he has not been able

## 2021-07-30 NOTE — ED Triage Notes (Signed)
Patient ambulatory to triage with steady gait, without difficulty or distress noted; pt reports here for detox assistance; denies any other c/o or injuries ?

## 2021-07-30 NOTE — ED Notes (Signed)
Pt reports he needs assistance and to be sent to a long term detox facility for drugs and alcohol. Pt states he uses alcohol and cocaine. States he did not drink on 4/4 but did use cocaine all day. Tearful when speaking about situation. Reports that he wants to go to a long term facility to get the help he needs and questions about a facility he has heard of that has a 2 year program.  ?

## 2021-07-30 NOTE — BH Assessment (Signed)
Referral information for Psychiatric Hospitalization faxed to;  ? ?[facility] ([phone]) Meg reports to contact facility back after 8:30am and ask to speak with Molly Maduro ?

## 2021-07-30 NOTE — ED Notes (Signed)
Discharged to lobby for [facility] pickup. Pt left with all of belongings. Did not wait for repeat vital signs prior to discharge. ?

## 2021-07-30 NOTE — ED Notes (Signed)
Patient transferred from Triage to room 24H after dressing out and screening for contraband. Report received from Lisa, RN including situation, background, assessment and recommendations. Pt oriented to Quad including Q15 minute rounds as well as Rover and Officer for their protection. Patient is alert and oriented, warm and dry in no acute distress. Patient denies SI, HI, and AVH. Pt. Encouraged to let this nurse know if needs arise. 

## 2021-07-30 NOTE — ED Notes (Signed)
Hospital meal provided.  100% consumed, pt tolerated w/o complaints.  Waste discarded appropriately.   

## 2021-08-12 ENCOUNTER — Encounter: Payer: Self-pay | Admitting: *Deleted

## 2021-08-12 ENCOUNTER — Other Ambulatory Visit: Payer: Self-pay

## 2021-08-12 DIAGNOSIS — F29 Unspecified psychosis not due to a substance or known physiological condition: Secondary | ICD-10-CM | POA: Insufficient documentation

## 2021-08-12 DIAGNOSIS — F10120 Alcohol abuse with intoxication, uncomplicated: Secondary | ICD-10-CM | POA: Insufficient documentation

## 2021-08-12 DIAGNOSIS — F1910 Other psychoactive substance abuse, uncomplicated: Secondary | ICD-10-CM | POA: Insufficient documentation

## 2021-08-12 DIAGNOSIS — Y908 Blood alcohol level of 240 mg/100 ml or more: Secondary | ICD-10-CM | POA: Insufficient documentation

## 2021-08-12 DIAGNOSIS — Z79899 Other long term (current) drug therapy: Secondary | ICD-10-CM | POA: Insufficient documentation

## 2021-08-12 LAB — CBC
HCT: 40.3 % (ref 39.0–52.0)
Hemoglobin: 13.9 g/dL (ref 13.0–17.0)
MCH: 31.8 pg (ref 26.0–34.0)
MCHC: 34.5 g/dL (ref 30.0–36.0)
MCV: 92.2 fL (ref 80.0–100.0)
Platelets: 394 10*3/uL (ref 150–400)
RBC: 4.37 MIL/uL (ref 4.22–5.81)
RDW: 12.4 % (ref 11.5–15.5)
WBC: 6.5 10*3/uL (ref 4.0–10.5)
nRBC: 0.3 % — ABNORMAL HIGH (ref 0.0–0.2)

## 2021-08-12 NOTE — ED Notes (Signed)
Black tee shirt ?Black sweatpaints ?Black sneakers ?White socks ?Black and white hat ?Plaid boxers ? ?

## 2021-08-12 NOTE — ED Triage Notes (Addendum)
Pt brought in by bpd.  Pt cursing and belligerent.  Security with pt.  Pt states he wants to kill everyone.  Etoh and drug use.   ?

## 2021-08-13 ENCOUNTER — Emergency Department
Admission: EM | Admit: 2021-08-13 | Discharge: 2021-08-13 | Disposition: A | Discharge status: Home / Self Care | Payer: Self-pay | Attending: Emergency Medicine | Admitting: Emergency Medicine

## 2021-08-13 DIAGNOSIS — F1910 Other psychoactive substance abuse, uncomplicated: Secondary | ICD-10-CM | POA: Diagnosis present

## 2021-08-13 DIAGNOSIS — F10929 Alcohol use, unspecified with intoxication, unspecified: Secondary | ICD-10-CM

## 2021-08-13 LAB — URINE DRUG SCREEN, QUALITATIVE (ARMC ONLY)
Amphetamines, Ur Screen: NOT DETECTED
Barbiturates, Ur Screen: NOT DETECTED
Benzodiazepine, Ur Scrn: POSITIVE — AB
Cannabinoid 50 Ng, Ur ~~LOC~~: POSITIVE — AB
Cocaine Metabolite,Ur ~~LOC~~: NOT DETECTED
MDMA (Ecstasy)Ur Screen: NOT DETECTED
Methadone Scn, Ur: NOT DETECTED
Opiate, Ur Screen: NOT DETECTED
Phencyclidine (PCP) Ur S: NOT DETECTED
Tricyclic, Ur Screen: NOT DETECTED

## 2021-08-13 LAB — COMPREHENSIVE METABOLIC PANEL
ALT: 78 U/L — ABNORMAL HIGH (ref 0–44)
AST: 35 U/L (ref 15–41)
Albumin: 4.3 g/dL (ref 3.5–5.0)
Alkaline Phosphatase: 98 U/L (ref 38–126)
Anion gap: 11 (ref 5–15)
BUN: 14 mg/dL (ref 6–20)
CO2: 24 mmol/L (ref 22–32)
Calcium: 9.1 mg/dL (ref 8.9–10.3)
Chloride: 107 mmol/L (ref 98–111)
Creatinine, Ser: 0.76 mg/dL (ref 0.61–1.24)
GFR, Estimated: >60 mL/min (ref >60)
Glucose, Bld: 99 mg/dL (ref 70–99)
Potassium: 3.6 mmol/L (ref 3.5–5.1)
Sodium: 142 mmol/L (ref 135–145)
Total Bilirubin: 0.3 mg/dL (ref 0.3–1.2)
Total Protein: 8.3 g/dL — ABNORMAL HIGH (ref 6.5–8.1)

## 2021-08-13 LAB — ACETAMINOPHEN LEVEL: Acetaminophen (Tylenol), Serum: <10 ug/mL — ABNORMAL LOW (ref 10–30)

## 2021-08-13 LAB — ETHANOL: Alcohol, Ethyl (B): 311 mg/dL (ref <10)

## 2021-08-13 LAB — SALICYLATE LEVEL: Salicylate Lvl: <7 mg/dL — ABNORMAL LOW (ref 7.0–30.0)

## 2021-08-13 MED ORDER — MIRTAZAPINE 15 MG PO TABS: 30.0000 mg | ORAL_TABLET | Freq: Every day | ORAL | Status: DC

## 2021-08-13 MED ORDER — MIRTAZAPINE 15 MG PO TABS
30.0000 mg | ORAL_TABLET | Freq: Every day | ORAL | Status: AC
  Administered 2021-08-13: 30 mg via ORAL
  Filled 2021-08-13: qty 2

## 2021-08-13 NOTE — ED Notes (Signed)
Urine sent to lab. Pt notified it will take about 20 minutes to result. Pt requesting his depression and other daily meds. Pt reports has been off of them for about a week since he couldn't afford a refill. Pt tearful when he isn't cussing staff out.  ?

## 2021-08-13 NOTE — ED Provider Notes (Signed)
? ?[facility] ?Provider Note ? ? ? Event Date/Time  ? First MD Initiated Contact with Patient 08/13/21 0025   ?  (approximate) ? ? ?History  ? ?Psychiatric Evaluation ? ? ?HPI ? ?Francisco Barrett is a 41 y.o. male with a history of homelessness, alcohol abuse, cocaine abuse who is brought in by police for intoxication and aggressive behavior.  Patient arrives extremely intoxicated, threatening to kill police officers and staff.  Patient endorses alcohol and marijuana.  Denies any recent cocaine use.  No medical complaints ?  ? ? ?Past Medical History:  ?Diagnosis Date  ? H/O ETOH abuse   ? Tobacco abuse   ? ? ?No past surgical history on file. ? ? ?Physical Exam  ? ?Triage Vital Signs: ?ED Triage Vitals  ?Enc Vitals Group  ?   BP 08/12/21 2323 (!) 140/112  ?   Pulse Rate 08/12/21 2323 (!) 46  ?   Resp 08/12/21 2323 20  ?   Temp 08/12/21 2323 98.7 ?F (37.1 ?C)  ?   Temp Source 08/12/21 2323 Oral  ?   SpO2 08/12/21 2323 96 %  ?   Weight 08/12/21 2324 132 lb (59.9 kg)  ?   Height 08/12/21 2324 5\' 3"  (1.6 m)  ?   Head Circumference --   ?   Peak Flow --   ?   Pain Score 08/12/21 2324 0  ?   Pain Loc --   ?   Pain Edu? --   ?   Excl. in GC? --   ? ? ?Most recent vital signs: ?Vitals:  ? 08/12/21 2323  ?BP: (!) 140/112  ?Pulse: (!) 46  ?Resp: 20  ?Temp: 98.7 ?F (37.1 ?C)  ?SpO2: 96%  ? ? ?General: No apparent distress ?HEENT: moist mucous membranes ?CV: RRR ?Pulm: Normal WOB ?GI: soft and non tender ?MSK: no edema or cyanosis ?Neuro: face symmetric, moving all extremities ?Psych: Agitated, belligerent, extremely intoxicated ? ?ED Results / Procedures / Treatments  ? ?Labs ?(all labs ordered are listed, but only abnormal results are displayed) ?Labs Reviewed  ?COMPREHENSIVE METABOLIC PANEL - Abnormal; Notable for the following components:  ?    Result Value  ? Total Protein 8.3 (*)   ? ALT 78 (*)   ? All other components within normal limits  ?ETHANOL - Abnormal; Notable for the following  components:  ? Alcohol, Ethyl (B) 311 (*)   ? All other components within normal limits  ?SALICYLATE LEVEL - Abnormal; Notable for the following components:  ? Salicylate Lvl <7.0 (*)   ? All other components within normal limits  ?ACETAMINOPHEN LEVEL - Abnormal; Notable for the following components:  ? Acetaminophen (Tylenol), Serum <10 (*)   ? All other components within normal limits  ?CBC - Abnormal; Notable for the following components:  ? nRBC 0.3 (*)   ? All other components within normal limits  ?URINE DRUG SCREEN, QUALITATIVE ([facility] ONLY) - Abnormal; Notable for the following components:  ? Cannabinoid 50 Ng, Ur [place] POSITIVE (*)   ? Benzodiazepine, Ur [place] POSITIVE (*)   ? All other components within normal limits  ? ? ? ?EKG ? ?none ? ? ?RADIOLOGY ?none ? ? ?PROCEDURES: ? ?Critical Care performed: No ? ?Procedures ? ? ? ?IMPRESSION / MDM / ASSESSMENT AND PLAN / ED COURSE  ?I reviewed the triage vital signs and the nursing notes. ? ?41 y.o. male with a history of homelessness, alcohol abuse, cocaine abuse who is brought  in by police for intoxication and aggressive behavior.  Patient is well-known in our emergency department with frequent visits for intoxication, malingering, and aggressive behavior.  Currently extremely intoxicated and aggressive making threats towards staff and police officers.  We will get labs for medical clearance and consult psychiatry and TTS for evaluation. ? ? ?MEDICATIONS GIVEN IN ED: ?Medications  ?mirtazapine (REMERON) tablet 30 mg (has no administration in time range)  ? ? ? ?ED COURSE: UDS positive for cannabinoids and benzos.  Alcohol level of 311.  Labs with no signs of dehydration, electrolyte derangements, or alcoholic ketoacidosis.  We will continue to monitor until patient is sober and evaluated by psychiatry. ? ? ?Consults: Psychiatry and TTS ? ? ?EMR reviewed including patient's most psychiatric admission from February 2023 ? ? ? ?FINAL CLINICAL IMPRESSION(S) / ED  DIAGNOSES  ? ?Final diagnoses:  ?Polysubstance abuse (HCC)  ?Alcoholic intoxication with complication (HCC)  ? ? ? ?Rx / DC Orders  ? ?ED Discharge Orders   ? ? None  ? ?  ? ? ? ?Note:  This document was prepared using Dragon voice recognition software and may include unintentional dictation errors. ? ? ?Please note:  Patient was evaluated in Emergency Department today for the symptoms described in the history of present illness. Patient was evaluated in the context of the global COVID-19 pandemic, which necessitated consideration that the patient might be at risk for infection with the SARS-CoV-2 virus that causes COVID-19. Institutional protocols and algorithms that pertain to the evaluation of patients at risk for COVID-19 are in a state of rapid change based on information released by regulatory bodies including the CDC and federal and state organizations. These policies and algorithms were followed during the patient's care in the ED.  Some ED evaluations and interventions may be delayed as a result of limited staffing during the pandemic. ? ? ? ? ?  ?Nita Sickle, MD ?08/13/21 3790 ? ?

## 2021-08-13 NOTE — ED Notes (Signed)
Dc instructions reviewed with pt. Pt to follow up tomorrow. No questions or concerns at this time. Ambulated with steady gait out of department ?

## 2021-08-13 NOTE — BH Assessment (Signed)
Attempted to assess patient with Pysc NP but patient was not able to be aroused.  ?

## 2021-08-13 NOTE — ED Notes (Signed)
Pt requesting his home remeron. EDP Don Perking and pt's primary RN notified.  ?

## 2021-08-13 NOTE — ED Provider Notes (Signed)
Procedures ? ?  ? ?----------------------------------------- ?12:11 PM on 08/13/2021 ?----------------------------------------- ? ?Patient is awake alert, ambulatory with steady gait.  He is tolerating oral intake and has eaten multiple lunch boxes.  He has clear speech.  He request to sleep Francisco Barrett in the emergency department but he is clinically sober and has been cleared by psychiatry for discharge.  No acute medical complaints. ? ?  ?Sharman Cheek, MD ?08/13/21 1211 ? ?

## 2021-08-13 NOTE — Consult Note (Signed)
[facility] Face-to-Face Psychiatry Consult  ? ?Reason for Consult: intoxication/aggressive behavior  ?Referring Physician:  EDP ?Patient Identification: Francisco Barrett ?MRN:  [id number] ?Principal Diagnosis: Polysubstance abuse (HCC) ?Diagnosis:  Principal Problem: ?  Polysubstance abuse (HCC) ? ? ?Total Time spent with patient: 45 minutes ? ?Subjective:   ?Amaro Vandell Kun is a 41 y.o. male patient admitted with "I drank a lot yesterday." ? ?HPI:  Patient presented to the ED at 11:30 PM last evening, under the influence of alcohol and illicit substances: BAL 311;  UDS positive for cannabinoids ans benzodiazepines.  ? ?On evaluation this morning, patient is sleepy, but coherent. Alert and oriented x 3. He states he is living with his sister "sometimes." He has been given resources for outpatient substance use and mental health services, and states he will follow up when he is ready. He denies suicidal or homicidal ideations, paranoia. No unsafe behavior observed today. Patient is requesting discharge. Patient cautioned against continued substance abuse, given outpatient resources information for [facility] and [facility].    ? ?Past Psychiatric History: polysubstance abuse ? ?Risk to Self:   ?Risk to Others:   ?Prior Inpatient Therapy:   ?Prior Outpatient Therapy:   ? ?Past Medical History:  ?Past Medical History:  ?Diagnosis Date  ? H/O ETOH abuse   ? Tobacco abuse   ? No past surgical history on file. ?Family History: No family history on file. ?Family Psychiatric  History:  ?Social History:  ?Social History  ? ?Substance and Sexual Activity  ?Alcohol Use Yes  ? Comment: 2 40's  ?   ?Social History  ? ?Substance and Sexual Activity  ?Drug Use Yes  ? Types: Marijuana, Cocaine  ?  ?Social History  ? ?Socioeconomic History  ? Marital status: Single  ?  Spouse name: Not on file  ? Number of children: Not on file  ? Years of education: Not on file  ? Highest education level: Not on file  ?Occupational History  ? Not  on file  ?Tobacco Use  ? Smoking status: Every Day  ?  Packs/day: 0.50  ?  Types: Cigarettes  ? Smokeless tobacco: Never  ?Vaping Use  ? Vaping Use: Never used  ?Substance and Sexual Activity  ? Alcohol use: Yes  ?  Comment: 2 40's  ? Drug use: Yes  ?  Types: Marijuana, Cocaine  ? Sexual activity: Not on file  ?Other Topics Concern  ? Not on file  ?Social History Narrative  ? Not on file  ? ?Social Determinants of Health  ? ?Financial Resource Strain: Not on file  ?Food Insecurity: Not on file  ?Transportation Needs: Not on file  ?Physical Activity: Not on file  ?Stress: Not on file  ?Social Connections: Not on file  ? ?Additional Social History: ?  ? ?Allergies:  No Known Allergies ? ?Labs:  ?Results for orders placed or performed during the hospital encounter of 08/13/21 (from the past 48 hour(s))  ?Comprehensive metabolic panel     Status: Abnormal  ? Collection Time: 08/12/21 11:34 PM  ?Result Value Ref Range  ? Sodium 142 135 - 145 mmol/L  ? Potassium 3.6 3.5 - 5.1 mmol/L  ? Chloride 107 98 - 111 mmol/L  ? CO2 24 22 - 32 mmol/L  ? Glucose, Bld 99 70 - 99 mg/dL  ?  Comment: Glucose reference range applies only to samples taken after fasting for at least 8 hours.  ? BUN 14 6 - 20 mg/dL  ? Creatinine, Ser 0.76 0.61 -  1.24 mg/dL  ? Calcium 9.1 8.9 - 10.3 mg/dL  ? Total Protein 8.3 (H) 6.5 - 8.1 g/dL  ? Albumin 4.3 3.5 - 5.0 g/dL  ? AST 35 15 - 41 U/L  ? ALT 78 (H) 0 - 44 U/L  ? Alkaline Phosphatase 98 38 - 126 U/L  ? Total Bilirubin 0.3 0.3 - 1.2 mg/dL  ? GFR, Estimated >60 >60 mL/min  ?  Comment: (NOTE) ?Calculated using the CKD-EPI Creatinine Equation (2021) ?  ? Anion gap 11 5 - 15  ?  Comment: Performed at [facility], [address] [place], [place] [address]  ?Ethanol     Status: Abnormal  ? Collection Time: 08/12/21 11:34 PM  ?Result Value Ref Range  ? Alcohol, Ethyl (B) 311 (HH) <10 mg/dL  ?  Comment: CRITICAL RESULT CALLED TO, READ BACK BY AND VERIFIED WITH ?ASHLEY [email or web address]  08/13/21  RH ?(NOTE) ?Lowest detectable limit for serum alcohol is 10 mg/dL. ? ?For medical purposes only. ?Performed at [facility], [address] [place], ?[place] [address] ?  ?Salicylate level     Status: Abnormal  ? Collection Time: 08/12/21 11:34 PM  ?Result Value Ref Range  ? Salicylate Lvl <7.0 (L) 7.0 - 30.0 mg/dL  ?  Comment: Performed at [facility], [address] [place], [place] [address]  ?Acetaminophen level     Status: Abnormal  ? Collection Time: 08/12/21 11:34 PM  ?Result Value Ref Range  ? Acetaminophen (Tylenol), Serum <10 (L) 10 - 30 ug/mL  ?  Comment: (NOTE) ?Therapeutic concentrations vary significantly. A range of 10-30 ug/mL  ?may be an effective concentration for many patients. However, some  ?are best treated at concentrations outside of this range. ?Acetaminophen concentrations >150 ug/mL at 4 hours after ingestion  ?and >50 ug/mL at 12 hours after ingestion are often associated with  ?toxic reactions. ? ?Performed at [facility], [address] [place], ?[place] [address] ?  ?cbc     Status: Abnormal  ? Collection Time: 08/12/21 11:34 PM  ?Result Value Ref Range  ? WBC 6.5 4.0 - 10.5 K/uL  ? RBC 4.37 4.22 - 5.81 MIL/uL  ? Hemoglobin 13.9 13.0 - 17.0 g/dL  ? HCT 40.3 39.0 - 52.0 %  ? MCV 92.2 80.0 - 100.0 fL  ? MCH 31.8 26.0 - 34.0 pg  ? MCHC 34.5 30.0 - 36.0 g/dL  ? RDW 12.4 11.5 - 15.5 %  ? Platelets 394 150 - 400 K/uL  ? nRBC 0.3 (H) 0.0 - 0.2 %  ?  Comment: Performed at [facility], [address], [place], [place] [address]  ?Urine Drug Screen, Qualitative     Status: Abnormal  ? Collection Time: 08/13/21  1:25 AM  ?Result Value Ref Range  ? Tricyclic, Ur Screen NONE DETECTED NONE DETECTED  ? Amphetamines, Ur Screen NONE DETECTED NONE DETECTED  ? MDMA (Ecstasy)Ur Screen NONE DETECTED NONE DETECTED  ? Cocaine Metabolite,Ur [place] NONE DETECTED NONE DETECTED  ? Opiate, Ur Screen NONE DETECTED NONE DETECTED  ? Phencyclidine (PCP) Ur S NONE DETECTED  NONE DETECTED  ? Cannabinoid 50 Ng, Ur [place] POSITIVE (A) NONE DETECTED  ? Barbiturates, Ur Screen NONE DETECTED NONE DETECTED  ? Benzodiazepine, Ur Scrn POSITIVE (A) NONE DETECTED  ? Methadone Scn, Ur NONE DETECTED NONE DETECTED  ?  Comment: (NOTE) ?Tricyclics + metabolites, urine    Cutoff 1000 ng/mL ?Amphetamines + metabolites, urine  Cutoff 1000 ng/mL ?MDMA (Ecstasy), urine  Cutoff 500 ng/mL ?Cocaine Metabolite, urine          Cutoff 300 ng/mL ?Opiate + metabolites, urine        Cutoff 300 ng/mL ?Phencyclidine (PCP), urine         Cutoff 25 ng/mL ?Cannabinoid, urine                 Cutoff 50 ng/mL ?Barbiturates + metabolites, urine  Cutoff 200 ng/mL ?Benzodiazepine, urine              Cutoff 200 ng/mL ?Methadone, urine                   Cutoff 300 ng/mL ? ?The urine drug screen provides only a preliminary, unconfirmed ?analytical test result and should not be used for non-medical ?purposes. Clinical consideration and professional judgment should ?be applied to any positive drug screen result due to possible ?interfering substances. A more specific alternate chemical method ?must be used in order to obtain a confirmed analytical result. ?Gas chromatography / mass spectrometry (GC/MS) is the preferred ?confirm atory method. ?Performed at [facility], [address] [place], ?[place] [id number] ?  ? ? ?No current facility-administered medications for this encounter.  ? ?Current Outpatient Medications  ?Medication Sig Dispense Refill  ? amLODipine (NORVASC) 5 MG tablet Take 2 tablets (10 mg total) by mouth once daily. (Patient not taking: Reported on 07/05/2021) 60 tablet 1  ? hydrOXYzine (ATARAX) 50 MG tablet Take 1 tablet (50 mg total) by mouth once every 6 (six) hours as needed for anxiety. (Patient not taking: Reported on 07/05/2021) 60 tablet 1  ? mirtazapine (REMERON) 30 MG tablet Take 1 tablet (30 mg total) by mouth once daily at bedtime. (Patient not taking: Reported on 07/05/2021) 30 tablet  1  ? nicotine (NICODERM CQ - DOSED IN MG/24 HOURS) 21 mg/24hr patch Place 1 patch (21 mg total) onto the skin daily. (Patient not taking: Reported on 07/05/2021) 28 patch 1  ? ? ?Musculoskeletal: ?Strength & Musc

## 2021-08-13 NOTE — Progress Notes (Signed)
Psychiatry brief note: The psych team (NP/TTS) came to see the patient in the emergency room, but he was not arousable despite shaking him gently by the shoulder and saying his name loudly several times. The patient had an elevated alcohol level on presentation and positive for substance use. The patient is calm and stable, and vitals-appropriate treatment is in place. At this time, the psych team cannot do the psychiatric assessment at this time will reassess once the patient is awake. BAL is 311 mg/dl and UDS positive for Cannabinoid and Benzodiazepine. ?

## 2021-08-13 NOTE — BH Assessment (Signed)
Comprehensive Clinical Assessment (CCA) Screening, Triage and Referral Note ? ?08/13/2021 ?Francisco Barrett ?[id number] ? ?Francisco Barrett, 41 year old male who presents to [facility] ED voluntarily for treatment. Per triage note, Pt brought in by bpd.  Pt cursing and belligerent.  Security with pt.  Pt states he wants to kill everyone.  Etoh and drug use.    ? ?During TTS assessment pt presents alert and oriented x 4, restless but cooperative, and mood-congruent with affect. The pt does not appear to be responding to internal or external stimuli. Neither is the pt presenting with any delusional thinking. Pt verified the information provided to triage RN.  ? ?Pt identifies his main complaint to be that he needs help with substance use. Patient reports drinking 7 beers last night and smoking marijuana. Patient was recently discharged from [facility]; however, he did not follow up with outpatient services. Patient states he is living with his sister from time to time and not currently working. Patient denies SI/HI/AH/VH. Patient was given resources for outpatient treatment and encouraged patient to follow up. Patient verbalized understanding. Pt contracts for safety.   ? ?Per Francisco Ober, NP pt shows no evidence of imminent risk to self or others at present and does not meet criteria for inpatient psychiatric admission.    ? ?Chief Complaint:  ?Chief Complaint  ?Patient presents with  ? Psychiatric Evaluation  ? ?Visit Diagnosis: Polysubstance abuse ? ?Patient Reported Information ?How did you hear about [place]? -- Mudlogger) ? ?What Is the Reason for Your Visit/Call Today? Patient brought to the ED by police. Patient was intoxicated stating he wants to kill everyone. ? ?How Long Has This Been Causing You Problems? > than 6 months ? ?What Do You Feel Would Help You the Most Today? Alcohol or Drug Use Treatment ? ? ?Have You Recently Had Any Thoughts About Hurting Yourself? No ? ?Are You Planning to Commit Suicide/Harm  Yourself At This time? No ? ? ?Have you Recently Had Thoughts About Hurting Someone Francisco Barrett? No ? ?Are You Planning to Harm Someone at This Time? No ? ?Explanation: No data recorded ? ?Have You Used Any Alcohol or Drugs in the Past 24 Hours? Yes ? ?How Long Ago Did You Use Drugs or Alcohol? No data recorded ?What Did You Use and How Much? Alcohol and marijuana- unknown ? ? ?Do You Currently Have a Therapist/Psychiatrist? No ? ?Name of Therapist/Psychiatrist: No data recorded ? ?Have You Been Recently Discharged From Any Office Practice or Programs? No ? ?Explanation of Discharge From Practice/Program: No data recorded ?  ?CCA Screening Triage Referral Assessment ?Type of Contact: Face-to-Face ? ?Telemedicine Service Delivery:   ?Is this Initial or Reassessment? No data recorded ?Date Telepsych consult ordered in [facility]:  No data recorded ?Time Telepsych consult ordered in [facility]:  No data recorded ?Location of Assessment: [facility] ED ? ?Provider Location: [facility] ED ? ? ?Collateral Involvement: None provided ? ? ?Does Patient Have a Automotive engineer Guardian? No data recorded ?Name and Contact of Legal Guardian: No data recorded ?If Minor and Not Living with Parent(s), Who has Custody? n/a ? ?Is CPS involved or ever been involved? Never ? ?Is APS involved or ever been involved? Never ? ? ?Patient Determined To Be At Risk for Harm To Self or Others Based on Review of Patient Reported Information or Presenting Complaint? No ? ?Method: No data recorded ?Availability of Means: No data recorded ?Intent: No data recorded ?Notification Required: No data recorded ?Additional Information for Danger to  Others Potential: No data recorded ?Additional Comments for Danger to Others Potential: No data recorded ?Are There Guns or Other Weapons in Your Home? No data recorded ?Types of Guns/Weapons: No data recorded ?Are These Weapons Safely Secured?                            No data recorded ?Who Could Verify You Are Able To Have These  Secured: No data recorded ?Do You Have any Outstanding Charges, Pending Court Dates, Parole/Probation? No data recorded ?Contacted To Inform of Risk of Harm To Self or Others: No data recorded ? ?Does Patient Present under Involuntary Commitment? No ? ?IVC Papers Initial File Date: 07/25/21 ? ? ?[place] of Residence: [place] ? ? ?Patient Currently Receiving the Following Services: Not Receiving Services ? ? ?Determination of Need: Urgent (48 hours) ? ? ?Options For Referral: ED Visit; Outpatient Therapy; Chemical Dependency Intensive Outpatient Therapy (CDIOP) ? ? ?Discharge Disposition:  ?  ? ?Special Ranes Dierdre Searles, Counselor, LCAS-A ? ? ?  ?  ?  ? ? ?

## 2021-08-19 ENCOUNTER — Other Ambulatory Visit: Payer: Self-pay

## 2021-08-19 ENCOUNTER — Emergency Department
Admission: EM | Admit: 2021-08-19 | Discharge: 2021-08-20 | Disposition: A | Discharge status: Home / Self Care | Payer: Self-pay | Attending: Emergency Medicine | Admitting: Emergency Medicine

## 2021-08-19 ENCOUNTER — Encounter: Payer: Self-pay | Admitting: Emergency Medicine

## 2021-08-19 DIAGNOSIS — Z765 Malingerer [conscious simulation]: Secondary | ICD-10-CM | POA: Insufficient documentation

## 2021-08-19 DIAGNOSIS — F1914 Other psychoactive substance abuse with psychoactive substance-induced mood disorder: Secondary | ICD-10-CM | POA: Insufficient documentation

## 2021-08-19 DIAGNOSIS — F1994 Other psychoactive substance use, unspecified with psychoactive substance-induced mood disorder: Secondary | ICD-10-CM

## 2021-08-19 DIAGNOSIS — R451 Restlessness and agitation: Secondary | ICD-10-CM | POA: Insufficient documentation

## 2021-08-19 MED ORDER — DROPERIDOL 2.5 MG/ML IJ SOLN
10.0000 mg | Freq: Once | INTRAMUSCULAR | Status: AC
  Administered 2021-08-20: 10 mg via INTRAMUSCULAR

## 2021-08-19 NOTE — ED Provider Notes (Signed)
? ?[facility] ?Provider Note ? ? ? Event Date/Time  ? First MD Initiated Contact with Patient 08/19/21 2345   ?  (approximate) ? ? ?History  ? ?Mental Health Problem ? ? ?Level 5 caveat:  history/ROS limited by active psychosis / mental illness / altered mental status / intoxication ? ? ? ?HPI ? ?Francisco Barrett is a 41 y.o. male well-known to the emergency department for frequent visits for both malingering and polysubstance abuse.  He presents tonight voluntarily reporting that he is seeing smurfs and needs help.  He will not give me any details about what he drank or used tonight but he has a history of polysubstance abuse and frequent alcohol intoxication. ? ?He is agitated, making sudden and jerking movements, intermittently threatening to pull out his own eyeballs so that he will stop seeing the smurfs, and occasionally laughing and seeming to think the whole situation is quite funny. ?  ? ? ?Physical Exam  ? ?Triage Vital Signs: ?ED Triage Vitals [08/19/21 2325]  ?Enc Vitals Group  ?   BP (!) 134/100  ?   Pulse Rate 89  ?   Resp 18  ?   Temp 97.8 ?F (36.6 ?C)  ?   Temp Source Oral  ?   SpO2 98 %  ?   Weight 59.9 kg (132 lb 0.9 oz)  ?   Height 1.6 m (5\' 3" )  ?   Head Circumference   ?   Peak Flow   ?   Pain Score 0  ?   Pain Loc   ?   Pain Edu?   ?   Excl. in GC?   ? ? ?Most recent vital signs: ?Vitals:  ? 08/19/21 2325  ?BP: (!) 134/100  ?Pulse: 89  ?Resp: 18  ?Temp: 97.8 ?F (36.6 ?C)  ?SpO2: 98%  ? ? ? ?General: Awake, agitated. ?CV:  Good peripheral perfusion.  ?Resp:  Normal effort.  Speaking easily, loudly, and comfortably. ?Abd:  No distention.  ?Other:  Ambulatory without difficulty.  Erratic behavior, occasionally verbally threatening, threatening to harm himself because of his visual hallucinations, tangential and redirectable but requires near constant redirection.  Threatening to leave and/or hurt himself. ? ? ?ED Results / Procedures / Treatments  ? ?Labs ?(all labs  ordered are listed, but only abnormal results are displayed) ?Labs Reviewed - No data to display ? ? ?EKG ? ?I reviewed prior EKGs on record and he has no history of QT prolongation ? ? ? ?PROCEDURES: ? ?Critical Care performed: Yes, see critical care procedure note(s) ? ?.Critical Care ?Performed by: 08/21/21, MD ?Authorized by: Loleta Rose, MD  ? ?Critical care provider statement:  ?  Critical care time (minutes):  30 ?  Critical care time was exclusive of:  Separately billable procedures and treating other patients ?  Critical care was necessary to treat or prevent imminent or life-threatening deterioration of the following conditions:  CNS failure or compromise and toxidrome ?  Critical care was time spent personally by me on the following activities:  Development of treatment plan with patient or surrogate, evaluation of patient's response to treatment, examination of patient, obtaining history from patient or surrogate, ordering and performing treatments and interventions, ordering and review of laboratory studies, ordering and review of radiographic studies, pulse oximetry, re-evaluation of patient's condition and review of old charts ? ? ?MEDICATIONS ORDERED IN ED: ?Medications  ?droperidol (INAPSINE) 2.5 MG/ML injection 10 mg (10 mg Intramuscular Given 08/20/21 0009)  ? ? ? ?  IMPRESSION / MDM / ASSESSMENT AND PLAN / ED COURSE  ?I reviewed the triage vital signs and the nursing notes. ?             ?               ? ?Differential diagnosis includes, but is not limited to, polysubstance abuse, substance-induced mood disorder, unspecified psychotic/psychiatric illness.  Acute medical crisis is unlikely. ? ?Patient is occasionally saying that he wants to leave and occasionally saying that he needs help but currently he clearly lacks capacity to make any decisions for himself and represents a danger at least himself and not others were able to be.  Given his frequent visits, I am comfortable holding off on  involuntary commitment unless it is absolutely necessary.  He was able to be cajoled into taking an intramuscular calming agent but I did require several security guard.  However a manual hold was not required.  I ordered droperidol 10 mg intramuscular and we will monitor carefully. ? ? ? ? ?Clinical Course as of 08/20/21 0738  ?Wed Aug 20, 2021  ?0020 Patient has calm, no longer yelling, breathing comfortably [CF]  ?9323 Patient has been calm and cooperative, sleeping overnight.  Anticipate discharge if patient is not acting dangerous or violent when awakened.  Discussed with Dr. Scotty Court who is assuming ED care. [CF]  ?  ?Clinical Course User Index ?[CF] Loleta Rose, MD  ? ? ? ?FINAL CLINICAL IMPRESSION(S) / ED DIAGNOSES  ? ?Final diagnoses:  ?Substance induced mood disorder (HCC)  ? ? ? ?Rx / DC Orders  ? ?ED Discharge Orders   ? ? None  ? ?  ? ? ? ?Note:  This document was prepared using Dragon voice recognition software and may include unintentional dictation errors. ?  ?Loleta Rose, MD ?08/20/21 [phone] ? ?

## 2021-08-19 NOTE — ED Triage Notes (Signed)
Patient ambulatory to triage with steady gait, without difficulty or distress noted; pt reports that he is "seeing smurfs and needs psychiatric help"; pt is homeless, has been seen multiple times recently for psych eval; denies SI or HI ?

## 2021-08-20 NOTE — ED Provider Notes (Signed)
Procedures ? ?Clinical Course as of 08/20/21 1056  ?Wed Aug 20, 2021  ?0020 Patient has calm, no longer yelling, breathing comfortably [CF]  ?1610 Patient has been calm and cooperative, sleeping overnight.  Anticipate discharge if patient is not acting dangerous or violent when awakened.  Discussed with Dr. Scotty Court who is assuming ED care. [CF]  ?  ?Clinical Course User Index ?[CF] Loleta Rose, MD  ? ? ?----------------------------------------- ?10:56 AM on 08/20/2021 ?----------------------------------------- ?Awake, calm, clear speech, steady gait. Stable for DC.  ? ? ?  ?Sharman Cheek, MD ?08/20/21 1056 ? ?

## 2021-08-20 NOTE — ED Notes (Signed)
Hospital meal provided, pt tolerated w/o complaints.  Waste discarded appropriately.  

## 2021-08-20 NOTE — ED Notes (Signed)
Given breakfast tray. 

## 2021-08-20 NOTE — ED Notes (Signed)
Pt not dressed out at this time. Previous RN and MD had made plan to d/c once awake. Pt remains sleeping. In hallway and is visible at this time.  ?

## 2021-08-20 NOTE — ED Notes (Addendum)
Ordered patient a breakfast tray. Cafeteria didn't bring enough up. ?

## 2021-08-20 NOTE — ED Notes (Signed)
Pt wakes easily. Denies any pain or issues. Gets up to bathroom independently. Denies SI/HI. Talking to self/responding to some auditory hallucinations.  ?

## 2021-08-20 NOTE — ED Notes (Signed)
Security Teacher, music with giving patient medication voluntarily.  ?

## 2021-10-18 ENCOUNTER — Emergency Department
Admission: EM | Admit: 2021-10-18 | Discharge: 2021-10-18 | Disposition: A | Discharge status: Home / Self Care | Payer: Self-pay | Attending: Emergency Medicine | Admitting: Emergency Medicine

## 2021-10-18 ENCOUNTER — Other Ambulatory Visit: Payer: Self-pay

## 2021-10-18 DIAGNOSIS — Y92410 Unspecified street and highway as the place of occurrence of the external cause: Secondary | ICD-10-CM | POA: Insufficient documentation

## 2021-10-18 DIAGNOSIS — S80811A Abrasion, right lower leg, initial encounter: Secondary | ICD-10-CM | POA: Insufficient documentation

## 2021-10-18 DIAGNOSIS — S80812A Abrasion, left lower leg, initial encounter: Secondary | ICD-10-CM | POA: Insufficient documentation

## 2021-10-18 DIAGNOSIS — Z008 Encounter for other general examination: Secondary | ICD-10-CM

## 2021-10-21 ENCOUNTER — Encounter: Payer: Self-pay | Admitting: Emergency Medicine

## 2021-10-21 ENCOUNTER — Emergency Department
Admission: EM | Admit: 2021-10-21 | Discharge: 2021-10-21 | Disposition: A | Discharge status: Home / Self Care | Payer: Self-pay | Attending: Emergency Medicine | Admitting: Emergency Medicine

## 2021-10-21 DIAGNOSIS — Z79899 Other long term (current) drug therapy: Secondary | ICD-10-CM | POA: Insufficient documentation

## 2021-10-21 DIAGNOSIS — Z203 Contact with and (suspected) exposure to rabies: Secondary | ICD-10-CM

## 2021-10-21 DIAGNOSIS — Z2914 Encounter for prophylactic rabies immune globin: Secondary | ICD-10-CM | POA: Insufficient documentation

## 2021-10-21 DIAGNOSIS — I10 Essential (primary) hypertension: Secondary | ICD-10-CM | POA: Insufficient documentation

## 2021-10-21 DIAGNOSIS — Z23 Encounter for immunization: Secondary | ICD-10-CM | POA: Insufficient documentation

## 2021-10-21 MED ORDER — RABIES VACCINE, PCEC IM SUSR
1.0000 mL | Freq: Once | INTRAMUSCULAR | Status: AC
  Administered 2021-10-21: 1 mL via INTRAMUSCULAR
  Filled 2021-10-21: qty 1

## 2021-10-21 MED ORDER — RABIES IMMUNE GLOBULIN 150 UNIT/ML IM INJ
20.0000 [IU]/kg | INJECTION | Freq: Once | INTRAMUSCULAR | Status: AC
  Administered 2021-10-21: 1200 [IU] via INTRAMUSCULAR
  Filled 2021-10-21: qty 8

## 2021-10-23 ENCOUNTER — Other Ambulatory Visit: Payer: Self-pay

## 2021-10-23 ENCOUNTER — Encounter: Payer: Self-pay | Admitting: Emergency Medicine

## 2021-10-23 ENCOUNTER — Emergency Department
Admission: EM | Admit: 2021-10-23 | Discharge: 2021-10-23 | Disposition: A | Discharge status: Home / Self Care | Payer: Self-pay | Attending: Emergency Medicine | Admitting: Emergency Medicine

## 2021-10-23 DIAGNOSIS — F439 Reaction to severe stress, unspecified: Secondary | ICD-10-CM

## 2021-10-23 DIAGNOSIS — Z87891 Personal history of nicotine dependence: Secondary | ICD-10-CM | POA: Insufficient documentation

## 2021-10-23 DIAGNOSIS — F430 Acute stress reaction: Secondary | ICD-10-CM | POA: Insufficient documentation

## 2021-10-23 DIAGNOSIS — F419 Anxiety disorder, unspecified: Secondary | ICD-10-CM | POA: Insufficient documentation

## 2021-10-23 DIAGNOSIS — G4700 Insomnia, unspecified: Secondary | ICD-10-CM | POA: Insufficient documentation

## 2021-10-23 MED ORDER — ACETAMINOPHEN 500 MG PO TABS
1000.0000 mg | ORAL_TABLET | Freq: Once | ORAL | Status: AC
  Administered 2021-10-23: 1000 mg via ORAL
  Filled 2021-10-23: qty 2

## 2021-10-23 MED ORDER — LORAZEPAM 2 MG/ML IJ SOLN
INTRAMUSCULAR | Status: AC
  Administered 2021-10-23: 2 mg via INTRAMUSCULAR
  Filled 2021-10-23: qty 1

## 2021-10-23 MED ORDER — LORAZEPAM 2 MG/ML IJ SOLN: 2.0000 mg | Freq: Once | INTRAMUSCULAR | Status: AC

## 2021-10-23 MED ORDER — DIPHENHYDRAMINE HCL 50 MG/ML IJ SOLN: 50.0000 mg | Freq: Once | INTRAMUSCULAR | Status: AC

## 2021-10-23 MED ORDER — HALOPERIDOL LACTATE 5 MG/ML IJ SOLN
INTRAMUSCULAR | Status: AC
Start: 2021-10-23 — End: 2021-10-23
  Administered 2021-10-23: 5 mg via INTRAMUSCULAR
  Filled 2021-10-23: qty 1

## 2021-10-23 MED ORDER — DIPHENHYDRAMINE HCL 50 MG/ML IJ SOLN
INTRAMUSCULAR | Status: AC
  Administered 2021-10-23: 50 mg via INTRAMUSCULAR
  Filled 2021-10-23: qty 1

## 2021-10-23 MED ORDER — HALOPERIDOL LACTATE 5 MG/ML IJ SOLN: 5.0000 mg | Freq: Once | INTRAMUSCULAR | Status: AC

## 2021-10-23 NOTE — ED Notes (Signed)
RN was getting vitals. Pt was continuously cursing. Pt then pulled pulse ox and BP cuff off and threw at RN.

## 2021-10-23 NOTE — ED Notes (Signed)
Pt resting on stretcher with head covered by blanket. No distress noted. Pt belongings at the bedside. Will continue to assess.

## 2021-10-23 NOTE — Discharge Instructions (Addendum)
Go to the [facility] tomorrow during office hours for your next rabies shot. Return to the ER for worsening symptoms, feelings of hurting yourself or others, or other concerns.

## 2021-10-23 NOTE — ED Notes (Signed)
Pt requests tylenol for HA; med admin and warm blankets given with cola

## 2021-10-23 NOTE — ED Notes (Signed)
Lunch tray and beverage provided 

## 2021-10-23 NOTE — ED Triage Notes (Signed)
Patient ambulatory to triage with steady gait, without difficulty or distress noted; pt reports he has been stressed out with his family and needs a place to stay for the night because he is going to have "nervous breakdown"; st hasn't been able to sleep; denies SI or HI

## 2021-10-23 NOTE — ED Notes (Signed)
Pt resting quietly on stretcher with eyes closed and even respirations. No acute distress noted at this time.  

## 2021-10-23 NOTE — ED Provider Notes (Signed)
[facility] Provider Note    Event Date/Time   First MD Initiated Contact with Patient 10/23/21 0413     (approximate)   History   Insomnia   HPI  Francisco Barrett is a 41 y.o. male well-known to the emergency department with a history of alcohol induced mood disorder, cocaine abuse, polysubstance abuse who reports he is stressed out due to family issues, has been unable to sleep and feels like he is having a "nervous breakdown".  Denies active SI/HI/AH/VH.  Voices no medical complaints.     Past Medical History   Past Medical History:  Diagnosis Date   H/O ETOH abuse    Tobacco abuse      Active Problem List   Patient Active Problem List   Diagnosis Date Noted   Alcohol-induced mood disorder (HCC)    Alcohol abuse 06/19/2021   Cocaine abuse (HCC) 06/19/2021   Essential hypertension 06/19/2021   Tobacco abuse    Polysubstance abuse (HCC)    Leukocytosis    Acute respiratory failure with hypoxia (HCC) 10/06/2020     Past Surgical History  History reviewed. No pertinent surgical history.   Home Medications   Prior to Admission medications   Medication Sig Start Date End Date Taking? Authorizing Provider  amLODipine (NORVASC) 5 MG tablet Take 2 tablets (10 mg total) by mouth once daily. Patient not taking: Reported on 07/05/2021 06/26/21   Willy Eddy, MD  hydrOXYzine (ATARAX) 50 MG tablet Take 1 tablet (50 mg total) by mouth once every 6 (six) hours as needed for anxiety. Patient not taking: Reported on 07/05/2021 06/26/21   Willy Eddy, MD  mirtazapine (REMERON) 30 MG tablet Take 1 tablet (30 mg total) by mouth once daily at bedtime. Patient not taking: Reported on 07/05/2021 06/26/21   Willy Eddy, MD  nicotine (NICODERM CQ - DOSED IN MG/24 HOURS) 21 mg/24hr patch Place 1 patch (21 mg total) onto the skin daily. Patient not taking: Reported on 07/05/2021 06/24/21   Clapacs, Jackquline Denmark, MD     Allergies  Patient has  no known allergies.   Family History  No family history on file.   Physical Exam  Triage Vital Signs: ED Triage Vitals  Enc Vitals Group     BP 10/23/21 0410 (!) 142/109     Pulse Rate 10/23/21 0410 86     Resp 10/23/21 0410 20     Temp 10/23/21 0410 98.3 F (36.8 C)     Temp Source 10/23/21 0410 Oral     SpO2 10/23/21 0410 99 %     Weight 10/23/21 0029 52 lb 6.8 oz (23.8 kg)     Height 10/23/21 0029 5\' 2"  (1.575 m)     Head Circumference --      Peak Flow --      Pain Score 10/23/21 0029 0     Pain Loc --      Pain Edu? --      Excl. in GC? --     Updated Vital Signs: BP (!) 142/109   Pulse 86   Temp 98.3 F (36.8 C) (Oral)   Resp 20   Ht 5\' 2"  (1.575 m)   Wt 23.8 kg   SpO2 99%   BMI 9.59 kg/m    General: Awake, mild distress.  CV:  RRR good peripheral perfusion.  Resp:  Normal effort.  CTA B. Abd:  Nontender.  No distention.  Other:  Agitated.   ED Results / Procedures /  Treatments  Labs (all labs ordered are listed, but only abnormal results are displayed) Labs Reviewed - No data to display   EKG  None   RADIOLOGY None   Official radiology report(s): No results found.   PROCEDURES:  Critical Care performed: No  Procedures   MEDICATIONS ORDERED IN ED: Medications  acetaminophen (TYLENOL) tablet 1,000 mg (1,000 mg Oral Given 10/23/21 0043)  diphenhydrAMINE (BENADRYL) injection 50 mg (50 mg Intramuscular Given 10/23/21 0427)  LORazepam (ATIVAN) injection 2 mg (2 mg Intramuscular Given 10/23/21 0426)  haloperidol lactate (HALDOL) injection 5 mg (5 mg Intramuscular Given 10/23/21 0426)     IMPRESSION / MDM / ASSESSMENT AND PLAN / ED COURSE  I reviewed the triage vital signs and the nursing notes.                             41 year old male presenting with insomnia and agitation.  No active SI/HI.  Discussed with the patient who is agreeable for IM calming agents.  Clinical Course as of 10/23/21 0650  Thu Oct 23, 2021  0649 Patient  sleeping.  Care will be transferred to the oncoming provider.  Anticipate discharge home later this morning once patient is awake. [JS]    Clinical Course User Index [JS] Irean Hong, MD     FINAL CLINICAL IMPRESSION(S) / ED DIAGNOSES   Final diagnoses:  Stress  Anxiety     Rx / DC Orders   ED Discharge Orders     None        Note:  This document was prepared using Dragon voice recognition software and may include unintentional dictation errors.   Irean Hong, MD 10/23/21 [phone]

## 2021-10-24 ENCOUNTER — Emergency Department
Admission: EM | Admit: 2021-10-24 | Discharge: 2021-10-24 | Disposition: A | Discharge status: Home / Self Care | Payer: Self-pay | Attending: Emergency Medicine | Admitting: Emergency Medicine

## 2021-10-24 ENCOUNTER — Other Ambulatory Visit: Payer: Self-pay

## 2021-10-24 ENCOUNTER — Encounter: Payer: Self-pay | Admitting: Emergency Medicine

## 2021-10-24 DIAGNOSIS — Z23 Encounter for immunization: Secondary | ICD-10-CM | POA: Insufficient documentation

## 2021-10-24 DIAGNOSIS — Z203 Contact with and (suspected) exposure to rabies: Secondary | ICD-10-CM | POA: Insufficient documentation

## 2021-10-24 MED ORDER — RABIES VACCINE, PCEC IM SUSR
1.0000 mL | Freq: Once | INTRAMUSCULAR | Status: AC
  Administered 2021-10-24: 1 mL via INTRAMUSCULAR
  Filled 2021-10-24: qty 1

## 2021-10-24 NOTE — ED Provider Notes (Signed)
   [facility] Provider Note    Event Date/Time   First MD Initiated Contact with Patient 10/24/21 1138     (approximate)   History   Rabies Injection   HPI  Krishang Maykel Reitter is a 41 y.o. male who reportedly presents for rabies vaccination.  However, patient left prior to my evaluation.  I did not see nor speak to this patient.     Physical Exam   Triage Vital Signs: ED Triage Vitals  Enc Vitals Group     BP 10/24/21 1136 (!) 128/95     Pulse Rate 10/24/21 1136 82     Resp 10/24/21 1136 18     Temp 10/24/21 1136 98.2 F (36.8 C)     Temp Source 10/24/21 1136 Oral     SpO2 10/24/21 1136 100 %     Weight 10/24/21 1134 52 lb 6.8 oz (23.8 kg)     Height 10/24/21 1134 5' (1.524 m)     Head Circumference --      Peak Flow --      Pain Score 10/24/21 1134 0     Pain Loc --      Pain Edu? --      Excl. in GC? --     Most recent vital signs: Vitals:   10/24/21 1136  BP: (!) 128/95  Pulse: 82  Resp: 18  Temp: 98.2 F (36.8 C)  SpO2: 100%       ED Results / Procedures / Treatments   Labs (all labs ordered are listed, but only abnormal results are displayed) Labs Reviewed - No data to display   MEDICATIONS ORDERED IN ED: Medications  rabies vaccine (RABAVERT) injection 1 mL (1 mL Intramuscular Given 10/24/21 1152)     IMPRESSION / MDM / ASSESSMENT AND PLAN / ED COURSE  I reviewed the triage vital signs and the nursing notes.   Patiently reportedly presented for rabies vaccination.  Order was placed by RN. I did not see nor speak to this patient.   FINAL CLINICAL IMPRESSION(S) / ED DIAGNOSES   Final diagnoses:  None     Rx / DC Orders   ED Discharge Orders     None        Note:  This document was prepared using Dragon voice recognition software and may include unintentional dictation errors.   Keturah Shavers 10/24/21 1514    Gilles Chiquito, MD 10/24/21 2022

## 2021-10-24 NOTE — ED Triage Notes (Signed)
Here for repeat rabies vaccine

## 2021-10-28 ENCOUNTER — Other Ambulatory Visit: Payer: Self-pay

## 2021-10-28 ENCOUNTER — Encounter: Payer: Self-pay | Admitting: Emergency Medicine

## 2021-10-28 ENCOUNTER — Emergency Department: Payer: Self-pay

## 2021-10-28 DIAGNOSIS — R079 Chest pain, unspecified: Secondary | ICD-10-CM | POA: Insufficient documentation

## 2021-10-28 DIAGNOSIS — Z23 Encounter for immunization: Secondary | ICD-10-CM | POA: Insufficient documentation

## 2021-10-28 DIAGNOSIS — I10 Essential (primary) hypertension: Secondary | ICD-10-CM | POA: Insufficient documentation

## 2021-10-28 LAB — CBC
HCT: 35.5 % — ABNORMAL LOW (ref 39.0–52.0)
Hemoglobin: 12.2 g/dL — ABNORMAL LOW (ref 13.0–17.0)
MCH: 32.5 pg (ref 26.0–34.0)
MCHC: 34.4 g/dL (ref 30.0–36.0)
MCV: 94.7 fL (ref 80.0–100.0)
Platelets: 331 10*3/uL (ref 150–400)
RBC: 3.75 MIL/uL — ABNORMAL LOW (ref 4.22–5.81)
RDW: 12.9 % (ref 11.5–15.5)
WBC: 6.3 10*3/uL (ref 4.0–10.5)
nRBC: 0 % (ref 0.0–0.2)

## 2021-10-28 LAB — BASIC METABOLIC PANEL
Anion gap: 7 (ref 5–15)
BUN: 24 mg/dL — ABNORMAL HIGH (ref 6–20)
CO2: 25 mmol/L (ref 22–32)
Calcium: 9.2 mg/dL (ref 8.9–10.3)
Chloride: 106 mmol/L (ref 98–111)
Creatinine, Ser: 1.03 mg/dL (ref 0.61–1.24)
GFR, Estimated: >60 mL/min (ref >60)
Glucose, Bld: 105 mg/dL — ABNORMAL HIGH (ref 70–99)
Potassium: 3.4 mmol/L — ABNORMAL LOW (ref 3.5–5.1)
Sodium: 138 mmol/L (ref 135–145)

## 2021-10-28 LAB — TROPONIN I (HIGH SENSITIVITY): Troponin I (High Sensitivity): 4 ng/L (ref <18)

## 2021-10-28 NOTE — ED Triage Notes (Addendum)
EMS brings [place] bus [place] for c/o CP; pt ambulatory without distress; reports left sided CP tonight that began after eating pizza; denies any accomp symptoms, st "feels like gas"; pt also reports times for next rabies vaccine

## 2021-10-29 ENCOUNTER — Emergency Department
Admission: EM | Admit: 2021-10-29 | Discharge: 2021-10-29 | Disposition: A | Discharge status: Home / Self Care | Payer: Self-pay | Attending: Emergency Medicine | Admitting: Emergency Medicine

## 2021-10-29 DIAGNOSIS — R079 Chest pain, unspecified: Secondary | ICD-10-CM

## 2021-10-29 LAB — HEPATIC FUNCTION PANEL
ALT: 33 U/L (ref 0–44)
AST: 29 U/L (ref 15–41)
Albumin: 3.9 g/dL (ref 3.5–5.0)
Alkaline Phosphatase: 67 U/L (ref 38–126)
Bilirubin, Direct: <0.1 mg/dL (ref 0.0–0.2)
Total Bilirubin: 0.2 mg/dL — ABNORMAL LOW (ref 0.3–1.2)
Total Protein: 7.1 g/dL (ref 6.5–8.1)

## 2021-10-29 LAB — LIPASE, BLOOD: Lipase: 36 U/L (ref 11–51)

## 2021-10-29 MED ORDER — RABIES VACCINE, PCEC IM SUSR
1.0000 mL | Freq: Once | INTRAMUSCULAR | Status: AC
Start: 2021-10-29 — End: 2021-10-29
  Administered 2021-10-29: 1 mL via INTRAMUSCULAR
  Filled 2021-10-29: qty 1
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
## 2021-10-29 NOTE — ED Notes (Signed)
Pt refused 2nd troponin, st "I'm not here for all that, I  just need my rabies shot"

## 2021-10-29 NOTE — ED Notes (Signed)
Pt given a food tray and water at this time

## 2021-11-25 ENCOUNTER — Emergency Department
Admission: EM | Admit: 2021-11-25 | Discharge: 2021-11-25 | Disposition: A | Discharge status: Home / Self Care | Payer: Self-pay | Attending: Student in an Organized Health Care Education/Training Program | Admitting: Student in an Organized Health Care Education/Training Program

## 2021-11-25 ENCOUNTER — Other Ambulatory Visit: Payer: Self-pay

## 2021-11-25 ENCOUNTER — Encounter: Payer: Self-pay | Admitting: Emergency Medicine

## 2021-11-25 ENCOUNTER — Emergency Department: Payer: Self-pay

## 2021-11-25 DIAGNOSIS — Y906 Blood alcohol level of 120-199 mg/100 ml: Secondary | ICD-10-CM | POA: Insufficient documentation

## 2021-11-25 DIAGNOSIS — M25511 Pain in right shoulder: Secondary | ICD-10-CM | POA: Insufficient documentation

## 2021-11-25 DIAGNOSIS — F1010 Alcohol abuse, uncomplicated: Secondary | ICD-10-CM | POA: Insufficient documentation

## 2021-11-25 DIAGNOSIS — S0990XA Unspecified injury of head, initial encounter: Secondary | ICD-10-CM | POA: Insufficient documentation

## 2021-11-25 LAB — BASIC METABOLIC PANEL
Anion gap: 11 (ref 5–15)
BUN: 22 mg/dL — ABNORMAL HIGH (ref 6–20)
CO2: 22 mmol/L (ref 22–32)
Calcium: 8.9 mg/dL (ref 8.9–10.3)
Chloride: 106 mmol/L (ref 98–111)
Creatinine, Ser: 0.87 mg/dL (ref 0.61–1.24)
GFR, Estimated: >60 mL/min (ref >60)
Glucose, Bld: 90 mg/dL (ref 70–99)
Potassium: 3.5 mmol/L (ref 3.5–5.1)
Sodium: 139 mmol/L (ref 135–145)

## 2021-11-25 LAB — CBC WITH DIFFERENTIAL/PLATELET
Abs Immature Granulocytes: 0.02 10*3/uL (ref 0.00–0.07)
Basophils Absolute: 0.1 10*3/uL (ref 0.0–0.1)
Basophils Relative: 1 %
Eosinophils Absolute: 0.3 10*3/uL (ref 0.0–0.5)
Eosinophils Relative: 6 %
HCT: 37.9 % — ABNORMAL LOW (ref 39.0–52.0)
Hemoglobin: 13 g/dL (ref 13.0–17.0)
Immature Granulocytes: 0 %
Lymphocytes Relative: 29 %
Lymphs Abs: 1.7 10*3/uL (ref 0.7–4.0)
MCH: 32.6 pg (ref 26.0–34.0)
MCHC: 34.3 g/dL (ref 30.0–36.0)
MCV: 95 fL (ref 80.0–100.0)
Monocytes Absolute: 0.5 10*3/uL (ref 0.1–1.0)
Monocytes Relative: 9 %
Neutro Abs: 3 10*3/uL (ref 1.7–7.7)
Neutrophils Relative %: 55 %
Platelets: 315 10*3/uL (ref 150–400)
RBC: 3.99 MIL/uL — ABNORMAL LOW (ref 4.22–5.81)
RDW: 12.2 % (ref 11.5–15.5)
WBC: 5.6 10*3/uL (ref 4.0–10.5)
nRBC: 0 % (ref 0.0–0.2)

## 2021-11-25 LAB — ETHANOL: Alcohol, Ethyl (B): 143 mg/dL — ABNORMAL HIGH (ref <10)

## 2021-11-25 LAB — TROPONIN I (HIGH SENSITIVITY): Troponin I (High Sensitivity): 4 ng/L (ref <18)
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
## 2021-11-25 NOTE — Discharge Instructions (Signed)
IMPRESSION: 1. No acute intracranial pathology. 2. Mildly displaced fracture of the nasal bridge, age indeterminate. Correlation with clinical exam recommended.     Electronically Signed   By: Elgie Collard M.D.   On: 11/25/2021 03:15
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
## 2021-12-13 ENCOUNTER — Emergency Department
Admission: EM | Admit: 2021-12-13 | Discharge: 2021-12-13 | Disposition: A | Discharge status: Home / Self Care | Payer: Self-pay | Attending: Emergency Medicine | Admitting: Emergency Medicine

## 2021-12-13 ENCOUNTER — Other Ambulatory Visit: Payer: Self-pay

## 2021-12-13 DIAGNOSIS — F1490 Cocaine use, unspecified, uncomplicated: Secondary | ICD-10-CM | POA: Insufficient documentation

## 2021-12-13 DIAGNOSIS — E876 Hypokalemia: Secondary | ICD-10-CM | POA: Insufficient documentation

## 2021-12-13 DIAGNOSIS — A084 Viral intestinal infection, unspecified: Secondary | ICD-10-CM | POA: Insufficient documentation

## 2021-12-13 LAB — CBC WITH DIFFERENTIAL/PLATELET
Abs Immature Granulocytes: 0.01 10*3/uL (ref 0.00–0.07)
Basophils Absolute: 0 10*3/uL (ref 0.0–0.1)
Basophils Relative: 0 %
Eosinophils Absolute: 0.1 10*3/uL (ref 0.0–0.5)
Eosinophils Relative: 1 %
HCT: 40.5 % (ref 39.0–52.0)
Hemoglobin: 14 g/dL (ref 13.0–17.0)
Immature Granulocytes: 0 %
Lymphocytes Relative: 14 %
Lymphs Abs: 1.1 10*3/uL (ref 0.7–4.0)
MCH: 32.4 pg (ref 26.0–34.0)
MCHC: 34.6 g/dL (ref 30.0–36.0)
MCV: 93.8 fL (ref 80.0–100.0)
Monocytes Absolute: 0.4 10*3/uL (ref 0.1–1.0)
Monocytes Relative: 5 %
Neutro Abs: 6.4 10*3/uL (ref 1.7–7.7)
Neutrophils Relative %: 80 %
Platelets: 326 10*3/uL (ref 150–400)
RBC: 4.32 MIL/uL (ref 4.22–5.81)
RDW: 11.9 % (ref 11.5–15.5)
WBC: 8 10*3/uL (ref 4.0–10.5)
nRBC: 0 % (ref 0.0–0.2)

## 2021-12-13 LAB — URINALYSIS, ROUTINE W REFLEX MICROSCOPIC
Bacteria, UA: NONE SEEN
Bilirubin Urine: NEGATIVE
Glucose, UA: NEGATIVE mg/dL
Ketones, ur: NEGATIVE mg/dL
Leukocytes,Ua: NEGATIVE
Nitrite: NEGATIVE
Protein, ur: 30 mg/dL — AB
Specific Gravity, Urine: 1.015 (ref 1.005–1.030)
Squamous Epithelial / HPF: NONE SEEN (ref 0–5)
pH: 5 (ref 5.0–8.0)

## 2021-12-13 LAB — COMPREHENSIVE METABOLIC PANEL
ALT: 36 U/L (ref 0–44)
AST: 31 U/L (ref 15–41)
Albumin: 4.6 g/dL (ref 3.5–5.0)
Alkaline Phosphatase: 69 U/L (ref 38–126)
Anion gap: 8 (ref 5–15)
BUN: 20 mg/dL (ref 6–20)
CO2: 25 mmol/L (ref 22–32)
Calcium: 9.2 mg/dL (ref 8.9–10.3)
Chloride: 101 mmol/L (ref 98–111)
Creatinine, Ser: 0.98 mg/dL (ref 0.61–1.24)
GFR, Estimated: >60 mL/min (ref >60)
Glucose, Bld: 109 mg/dL — ABNORMAL HIGH (ref 70–99)
Potassium: 3.2 mmol/L — ABNORMAL LOW (ref 3.5–5.1)
Sodium: 134 mmol/L — ABNORMAL LOW (ref 135–145)
Total Bilirubin: 0.7 mg/dL (ref 0.3–1.2)
Total Protein: 8.1 g/dL (ref 6.5–8.1)

## 2021-12-13 LAB — TROPONIN I (HIGH SENSITIVITY)
Troponin I (High Sensitivity): 5 ng/L (ref <18)
Troponin I (High Sensitivity): 3 ng/L (ref <18)

## 2021-12-13 MED ORDER — POTASSIUM CHLORIDE CRYS ER 20 MEQ PO TBCR
40.0000 meq | EXTENDED_RELEASE_TABLET | Freq: Once | ORAL | Status: AC
  Administered 2021-12-13: 40 meq via ORAL
  Filled 2021-12-13: qty 2

## 2021-12-13 MED ORDER — LACTATED RINGERS IV BOLUS
1000.0000 mL | Freq: Once | INTRAVENOUS | Status: AC
  Administered 2021-12-13: 1000 mL via INTRAVENOUS

## 2021-12-13 MED ORDER — ONDANSETRON 4 MG PO TBDP
4.0000 mg | ORAL_TABLET | Freq: Three times a day (TID) | ORAL | 0 refills | Status: DC | PRN
  Filled 2021-12-13: qty 12, 4d supply, fill #0

## 2021-12-13 MED ORDER — ONDANSETRON HCL 4 MG/2ML IJ SOLN
4.0000 mg | Freq: Once | INTRAMUSCULAR | Status: AC
  Administered 2021-12-13: 4 mg via INTRAVENOUS
  Filled 2021-12-13: qty 2

## 2021-12-13 NOTE — ED Triage Notes (Signed)
Ambulatory to triage with c/o nausea and vomiting. Pt reports he is also having visual hallucinations at this time. Admits to using powder cocaine apx 1 hour ago but states he was having N/V sx before using drugs.

## 2021-12-13 NOTE — ED Notes (Signed)
Pt sleeping in lobby 

## 2021-12-13 NOTE — ED Notes (Signed)
Pt sleeping in lobby in no acute distress.

## 2021-12-13 NOTE — ED Provider Notes (Signed)
[facility] Provider Note    Event Date/Time   First MD Initiated Contact with Patient 12/13/21 0703     (approximate)   History   Chief Complaint Hallucinations and Nausea   HPI  Francisco Barrett is a 41 y.o. male with past medical history alcohol abuse and polysubstance abuse who presents to the ED complaining of nausea.  Patient reports that for the past 2 days he has been dealing with nausea, vomiting, diarrhea, and abdominal pain.  He describes the pain as diffuse and crampy, worse when he has to go to have a bowel movement.  He has been passing watery stool, denies any blood in his stool.  He has had multiple episodes of vomiting and states his oral intake has been decreased.  He denies any fevers, cough, chest pain, shortness of breath, dysuria, or flank pain.  He is not aware of any sick contacts.  He does admit to cocaine use prior to arrival, states at one point he was having visual hallucinations but states this has resolved.  He denies any auditory hallucinations, suicidal or homicidal ideation.  He does state that he is interested in getting off drugs.     Physical Exam   Triage Vital Signs: ED Triage Vitals  Enc Vitals Group     BP 12/13/21 0319 (!) 145/90     Pulse Rate 12/13/21 0319 74     Resp 12/13/21 0319 18     Temp 12/13/21 0319 98.2 F (36.8 C)     Temp Source 12/13/21 0319 Oral     SpO2 12/13/21 0319 99 %     Weight 12/13/21 0315 120 lb (54.4 kg)     Height 12/13/21 0315 5\' 1"  (1.549 m)     Head Circumference --      Peak Flow --      Pain Score 12/13/21 0315 0     Pain Loc --      Pain Edu? --      Excl. in GC? --     Most recent vital signs: Vitals:   12/13/21 0545 12/13/21 0757  BP: (!) 139/97 125/89  Pulse: (!) 59 (!) 56  Resp: 16 16  Temp: 97.7 F (36.5 C) 97.8 F (36.6 C)  SpO2: 99% 99%    Constitutional: Alert and oriented. Eyes: Conjunctivae are normal. Head: Atraumatic. Nose: No  congestion/rhinnorhea. Mouth/Throat: Mucous membranes are moist.  Cardiovascular: Normal rate, regular rhythm. Grossly normal heart sounds.  2+ radial pulses bilaterally. Respiratory: Normal respiratory effort.  No retractions. Lungs CTAB. Gastrointestinal: Soft and nontender. No distention. Musculoskeletal: No lower extremity tenderness nor edema.  Neurologic:  Normal speech and language. No gross focal neurologic deficits are appreciated.    ED Results / Procedures / Treatments   Labs (all labs ordered are listed, but only abnormal results are displayed) Labs Reviewed  COMPREHENSIVE METABOLIC PANEL - Abnormal; Notable for the following components:      Result Value   Sodium 134 (*)    Potassium 3.2 (*)    Glucose, Bld 109 (*)    All other components within normal limits  URINALYSIS, ROUTINE W REFLEX MICROSCOPIC - Abnormal; Notable for the following components:   Color, Urine YELLOW (*)    APPearance CLEAR (*)    Hgb urine dipstick MODERATE (*)    Protein, ur 30 (*)    All other components within normal limits  CBC WITH DIFFERENTIAL/PLATELET  TROPONIN I (HIGH SENSITIVITY)  TROPONIN I (HIGH SENSITIVITY)  EKG  ED ECG REPORT I, Chesley Noon, the attending physician, personally viewed and interpreted this ECG.   Date: 12/13/2021  EKG Time: 3:23  Rate: 66  Rhythm: normal sinus rhythm  Axis: Normal  Intervals:none  ST&T Change: None  PROCEDURES:  Critical Care performed: No  Procedures   MEDICATIONS ORDERED IN ED: Medications  lactated ringers bolus 1,000 mL (1,000 mLs Intravenous New Bag/Given 12/13/21 0801)  ondansetron (ZOFRAN) injection 4 mg (4 mg Intravenous Given 12/13/21 0802)  potassium chloride SA (KLOR-CON M) CR tablet 40 mEq (40 mEq Oral Given 12/13/21 0803)     IMPRESSION / MDM / ASSESSMENT AND PLAN / ED COURSE  I reviewed the triage vital signs and the nursing notes.                              41 y.o. male with past medical history of  alcohol abuse and polysubstance abuse who presents to the ED complaining of 2 days of nausea, vomiting, and diarrhea associated with diffuse crampy abdominal pain, admits to some hallucinations this morning after using cocaine.  Patient's presentation is most consistent with acute presentation with potential threat to life or bodily function.  Differential diagnosis includes, but is not limited to, gastroenteritis, colitis, diverticulitis, appendicitis, dehydration, electrolyte abnormality, AKI, psychosis, and substance abuse.  Patient nontoxic-appearing and in no acute distress, vital signs are unremarkable.  He has a benign abdominal exam and his GI symptoms sound consistent with gastroenteritis given vomiting and diarrhea.  Labs are reassuring with no significant anemia, leukocytosis, or AKI, does show mild hypokalemia which we will replete.  LFTs are unremarkable, 2 sets of troponin are negative.  We will treat symptomatically with IV Zofran and hydrate with IV fluids, no indication for CT imaging at this time.  Reported hallucinations are likely secondary to cocaine abuse, no evidence of psychosis at this time and patient denies any suicidal or homicidal ideation.  He was offered psychiatry consultation, but declines.  He would benefit from outpatient substance abuse treatment and is agreeable to receiving resources.  Patient reports feeling better following IV Zofran and fluids, now tolerating oral intake without difficulty with no ongoing hallucinations.  He is appropriate for discharge home with PCP follow-up, was provided with resources for counseling and substance abuse.  He Was counseled to return to the ED for new or worsening symptoms, patient agrees with plan.      FINAL CLINICAL IMPRESSION(S) / ED DIAGNOSES   Final diagnoses:  Viral gastroenteritis  Cocaine use     Rx / DC Orders   ED Discharge Orders          Ordered    ondansetron (ZOFRAN-ODT) 4 MG disintegrating tablet   Every 8 hours PRN        12/13/21 0911             Note:  This document was prepared using Dragon voice recognition software and may include unintentional dictation errors.   Chesley Noon, MD 12/13/21 [phone]

## 2021-12-14 ENCOUNTER — Other Ambulatory Visit: Payer: Self-pay

## 2021-12-15 ENCOUNTER — Other Ambulatory Visit: Payer: Self-pay

## 2021-12-17 NOTE — Congregational Nurse Program (Signed)
  Dept: [phone]   Congregational Nurse Program Note  Date of Encounter: 12/17/2021 Client to [facility] clinic today reporting that he had a rash to his inner right arm, that he scratched and made an excoriated area, now with some scabbing. He requested antibiotic ointment and a covering. RN provided this first aide and requested client to return tomorrow for a follow up. Client voiced understanding. Support given. No other needs at this time.  Past Medical History: Past Medical History:  Diagnosis Date   H/O ETOH abuse    Tobacco abuse     Encounter Details:  CNP Questionnaire - 12/17/21 1149       Questionnaire   Do you give verbal consent to treat you today? Yes    Location Patient Served  [facility]    Visit Setting Church or Organization    Patient Status Homeless    Insurance Uninsured ([employer]/Self-Pay)    Insurance Referral N/A    Medication N/A    Medical Provider No    Screening Referrals N/A    Medical Referral N/A    Medical Appointment Made N/A    Food N/A    Transportation N/A    Housing/Utilities No permanent housing    Interpersonal Safety N/A    Intervention Support    ED Visit Averted N/A    Life-Saving Intervention Made N/A

## 2022-01-02 ENCOUNTER — Other Ambulatory Visit: Payer: Self-pay

## 2022-01-08 ENCOUNTER — Other Ambulatory Visit: Payer: Self-pay

## 2022-01-08 ENCOUNTER — Emergency Department: Payer: Self-pay

## 2022-01-08 ENCOUNTER — Emergency Department
Admission: EM | Admit: 2022-01-08 | Discharge: 2022-01-09 | Disposition: A | Discharge status: Home / Self Care | Payer: Self-pay | Attending: Emergency Medicine | Admitting: Emergency Medicine

## 2022-01-08 DIAGNOSIS — S022XXA Fracture of nasal bones, initial encounter for closed fracture: Secondary | ICD-10-CM

## 2022-01-08 DIAGNOSIS — Z23 Encounter for immunization: Secondary | ICD-10-CM | POA: Insufficient documentation

## 2022-01-08 DIAGNOSIS — S0990XA Unspecified injury of head, initial encounter: Secondary | ICD-10-CM

## 2022-01-08 DIAGNOSIS — S0240DA Maxillary fracture, left side, initial encounter for closed fracture: Secondary | ICD-10-CM

## 2022-01-08 DIAGNOSIS — F10129 Alcohol abuse with intoxication, unspecified: Secondary | ICD-10-CM

## 2022-01-08 DIAGNOSIS — S0231XA Fracture of orbital floor, right side, initial encounter for closed fracture: Secondary | ICD-10-CM

## 2022-01-08 DIAGNOSIS — Y908 Blood alcohol level of 240 mg/100 ml or more: Secondary | ICD-10-CM | POA: Insufficient documentation

## 2022-01-08 LAB — CBC
HCT: 39.8 % (ref 39.0–52.0)
Hemoglobin: 13.8 g/dL (ref 13.0–17.0)
MCH: 32.2 pg (ref 26.0–34.0)
MCHC: 34.7 g/dL (ref 30.0–36.0)
MCV: 93 fL (ref 80.0–100.0)
Platelets: 313 10*3/uL (ref 150–400)
RBC: 4.28 MIL/uL (ref 4.22–5.81)
RDW: 12.3 % (ref 11.5–15.5)
WBC: 9.4 10*3/uL (ref 4.0–10.5)
nRBC: 0 % (ref 0.0–0.2)

## 2022-01-08 MED ORDER — ZIPRASIDONE MESYLATE 20 MG IM SOLR
20.0000 mg | Freq: Once | INTRAMUSCULAR | Status: AC
  Administered 2022-01-08: 20 mg via INTRAMUSCULAR
  Filled 2022-01-08: qty 20

## 2022-01-08 MED ORDER — LORAZEPAM 2 MG/ML IJ SOLN
1.0000 mg | Freq: Once | INTRAMUSCULAR | Status: AC
  Administered 2022-01-09: 1 mg via INTRAVENOUS
  Filled 2022-01-08: qty 1

## 2022-01-08 MED ORDER — LIDOCAINE HCL (PF) 1 % IJ SOLN
INTRAMUSCULAR | Status: AC
  Administered 2022-01-08: 5 mL
  Filled 2022-01-08: qty 5

## 2022-01-08 MED ORDER — TETANUS-DIPHTH-ACELL PERTUSSIS 5-2.5-18.5 LF-MCG/0.5 IM SUSY
0.5000 mL | PREFILLED_SYRINGE | Freq: Once | INTRAMUSCULAR | Status: AC
  Administered 2022-01-08: 0.5 mL via INTRAMUSCULAR
  Filled 2022-01-08: qty 0.5
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
## 2022-01-08 NOTE — ED Triage Notes (Signed)
Provider at bedside.  Assessment completed.  Wound cleansed with NS and 4x4.   Staples and Sutures applied to back of head by provider.
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
## 2022-01-09 ENCOUNTER — Other Ambulatory Visit: Payer: Self-pay

## 2022-01-09 LAB — COMPREHENSIVE METABOLIC PANEL WITH GFR
ALT: 47 U/L — ABNORMAL HIGH (ref 0–44)
AST: 47 U/L — ABNORMAL HIGH (ref 15–41)
Albumin: 4.5 g/dL (ref 3.5–5.0)
Alkaline Phosphatase: 72 U/L (ref 38–126)
Anion gap: 13 (ref 5–15)
BUN: 13 mg/dL (ref 6–20)
CO2: 22 mmol/L (ref 22–32)
Calcium: 8.6 mg/dL — ABNORMAL LOW (ref 8.9–10.3)
Chloride: 108 mmol/L (ref 98–111)
Creatinine, Ser: 0.69 mg/dL (ref 0.61–1.24)
GFR, Estimated: >60 mL/min
Glucose, Bld: 119 mg/dL — ABNORMAL HIGH (ref 70–99)
Potassium: 3.9 mmol/L (ref 3.5–5.1)
Sodium: 143 mmol/L (ref 135–145)
Total Bilirubin: 0.4 mg/dL (ref 0.3–1.2)
Total Protein: 7.9 g/dL (ref 6.5–8.1)

## 2022-01-09 LAB — ETHANOL: Alcohol, Ethyl (B): 352 mg/dL

## 2022-01-09 MED ORDER — DEXTROSE-NACL 5-0.9 % IV SOLN: INTRAVENOUS | Status: DC

## 2022-01-09 MED ORDER — THIAMINE HCL 100 MG/ML IJ SOLN
100.0000 mg | Freq: Once | INTRAMUSCULAR | Status: AC
  Administered 2022-01-09: 100 mg via INTRAVENOUS
  Filled 2022-01-09: qty 2

## 2022-01-09 MED ORDER — AMOXICILLIN 500 MG PO CAPS
500.0000 mg | ORAL_CAPSULE | Freq: Three times a day (TID) | ORAL | 0 refills | Status: DC
  Filled 2022-01-09: qty 30, 10d supply, fill #0

## 2022-01-09 MED ORDER — NAPROXEN 500 MG PO TABS
500.0000 mg | ORAL_TABLET | Freq: Two times a day (BID) | ORAL | 0 refills | Status: AC
  Filled 2022-01-09: qty 30, 15d supply, fill #0

## 2022-01-09 NOTE — ED Notes (Signed)
DC instructions and scripts reviewed with pt. Pt concerned with cost of prescription due to no money. This nurse contacted pharmacy to get cost of scripts. Per pharmacy pt will qualify for low income assistance and needs to fill out form. Pt made aware and will go pick up prescription at this time. Pt also aware of how to care for head laceration where staples were placed and that staples need to be removed in 10 days. Pt declined wheelchair and ambulated out of ED> with provided paper scrubs.
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
## 2022-01-09 NOTE — ED Notes (Signed)
Pt continuing to rest on left side. Pt urinated in bed, sheets changed. Pt requested water. Pt to be discharged once awake.

## 2022-01-10 ENCOUNTER — Other Ambulatory Visit: Payer: Self-pay

## 2022-01-10 ENCOUNTER — Emergency Department
Admission: EM | Admit: 2022-01-10 | Discharge: 2022-01-10 | Disposition: A | Discharge status: Home / Self Care | Payer: Self-pay | Attending: Emergency Medicine | Admitting: Emergency Medicine

## 2022-01-10 ENCOUNTER — Emergency Department: Payer: Self-pay

## 2022-01-10 DIAGNOSIS — M79601 Pain in right arm: Secondary | ICD-10-CM | POA: Insufficient documentation

## 2022-01-10 MED ORDER — OXYCODONE-ACETAMINOPHEN 5-325 MG PO TABS
1.0000 | ORAL_TABLET | Freq: Once | ORAL | Status: AC
  Administered 2022-01-10: 1 via ORAL
  Filled 2022-01-10: qty 1
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
## 2022-01-10 NOTE — Discharge Instructions (Addendum)
Please continue your pain medication as prescribed.  Your x-ray showed no fracture.  You may use your sling for comfort but I recommend taking your arm out of this several times a day to move it around so that you do not develop a frozen shoulder.   Steps to find a Primary Care Provider (PCP):  Call [phone] or [phone] to access "[facility]."  2.  You may also go on the [facility] website at [email or web address]
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
## 2022-01-13 ENCOUNTER — Emergency Department
Admission: EM | Admit: 2022-01-13 | Discharge: 2022-01-13 | Disposition: A | Discharge status: Home / Self Care | Payer: Self-pay | Attending: Emergency Medicine | Admitting: Emergency Medicine

## 2022-01-13 ENCOUNTER — Encounter: Payer: Self-pay | Admitting: Emergency Medicine

## 2022-01-13 ENCOUNTER — Other Ambulatory Visit: Payer: Self-pay

## 2022-01-13 DIAGNOSIS — Z4800 Encounter for change or removal of nonsurgical wound dressing: Secondary | ICD-10-CM | POA: Insufficient documentation

## 2022-01-13 DIAGNOSIS — I10 Essential (primary) hypertension: Secondary | ICD-10-CM | POA: Insufficient documentation

## 2022-01-13 DIAGNOSIS — Z5189 Encounter for other specified aftercare: Secondary | ICD-10-CM

## 2022-01-13 MED ORDER — BACITRACIN ZINC 500 UNIT/GM EX OINT
TOPICAL_OINTMENT | Freq: Once | CUTANEOUS | Status: AC
  Administered 2022-01-13: 1 via TOPICAL
  Filled 2022-01-13: qty 4.5

## 2022-01-13 MED ORDER — BACITRACIN 500 UNIT/GM EX OINT
1.0000 | TOPICAL_OINTMENT | Freq: Two times a day (BID) | CUTANEOUS | 0 refills | Status: AC
  Filled 2022-01-13: qty 15, 8d supply, fill #0

## 2022-01-13 MED ORDER — CEPHALEXIN 500 MG PO CAPS
1000.0000 mg | ORAL_CAPSULE | Freq: Two times a day (BID) | ORAL | 0 refills | Status: AC
  Filled 2022-01-13: qty 28, 7d supply, fill #0
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
## 2022-01-13 NOTE — Discharge Instructions (Addendum)
-  Please return to the emergency department in 2-3 days for staple removal.  -Please apply antibiotic ointment to the wound site twice a day.  You may keep it covered at night, however keep it uncovered throughout the day to allow it to dry out.  -Please discontinue the amoxicillin.  You may start taking the cephalexin as prescribed.  You may continue to take the naproxen for pain.  -Return to the emergency department anytime if you begin to experience any new or worsening symptoms.

## 2022-01-13 NOTE — ED Notes (Signed)
Wound to back of head cleaned with soap and water, then dressed with bacitracin, non adherent pad and gauze wrap. Pt instructed in at home wound care. Small amount of bleeding noted and area of poor approximation noted to center of wound. Pt denies complaint at this time.

## 2022-01-13 NOTE — ED Triage Notes (Signed)
Pt via POV from home. Pt was seen here and had staples insert on 9/9. States that he wanted to make sure everything. States it feels swollen. No drainage. Pt is A&Ox4 and NAD

## 2022-01-20 ENCOUNTER — Emergency Department
Admission: EM | Admit: 2022-01-20 | Discharge: 2022-01-20 | Disposition: A | Discharge status: Home / Self Care | Payer: Self-pay | Attending: Emergency Medicine | Admitting: Emergency Medicine

## 2022-01-20 ENCOUNTER — Other Ambulatory Visit: Payer: Self-pay

## 2022-01-20 ENCOUNTER — Encounter: Payer: Self-pay | Admitting: Emergency Medicine

## 2022-01-20 DIAGNOSIS — Z4802 Encounter for removal of sutures: Secondary | ICD-10-CM | POA: Insufficient documentation

## 2022-01-20 NOTE — ED Triage Notes (Signed)
Pt to ED from home c/o staple removal from posterior head.  4 staples to be removed.  No other complaints.  Dr. Jacqualine Code to bedside for removal at this time.

## 2022-01-20 NOTE — ED Provider Notes (Signed)
[facility] Provider Note   Event Date/Time   First MD Initiated Contact with Patient 01/20/22 2021     (approximate)  History   Suture / Staple Removal  HPI  Francisco Barrett is a 41 y.o. male here for evaluation to have his staples removed from the scalp.  He reports his suture was previously removed but he was told to come back in a couple days to have his staples removed.  He advises he has a total of 4 staples across the scalp.  He was placed on antibiotic because the area was possibly infected earlier, but reports that the tenderness and swelling in the area has improved and he continues on his antibiotic  He has not seen any more drainage swelling or pain from the area reports it feels like it is healing.  No fevers or chills.  No headache     Physical Exam   Triage Vital Signs: ED Triage Vitals  Enc Vitals Group     BP 01/20/22 2032 132/87     Pulse Rate 01/20/22 2032 79     Resp 01/20/22 2032 16     Temp 01/20/22 2032 98.7 F (37.1 C)     Temp Source 01/20/22 2032 Oral     SpO2 01/20/22 2032 99 %     Weight 01/20/22 2029 119 lb 0.8 oz (54 kg)     Height 01/20/22 2029 5\' 3"  (1.6 m)     Head Circumference --      Peak Flow --      Pain Score 01/20/22 2029 0     Pain Loc --      Pain Edu? --      Excl. in [place]? --     Most recent vital signs: Vitals:   01/20/22 2032  BP: 132/87  Pulse: 79  Resp: 16  Temp: 98.7 F (37.1 C)  SpO2: 99%    See clinical media upload  Patient walking without discomfort.  Normocephalic atraumatic except for the posterior scalp somewhat along the upper occipital region and parietal has a area of healing laceration.  There is no drainage no erythema no tenderness.  Appears well aligned without evidence of infection.  4 staples are present, each of these was removed without difficulty.  Evaluated further no other additional staples or sutures are to noted.  General: Awake, no distress.  CV:  Good  peripheral perfusion.  Resp:  Normal effort.  Abd:  No distention.  Other:  Ambulates with normal gait.   ED Results / Procedures / Treatments   Labs (all labs ordered are listed, but only abnormal results are displayed) Labs Reviewed - No data to display   EKG     RADIOLOGY     PROCEDURES:   Staple removal    MEDICATIONS ORDERED IN ED: Medications - No data to display   IMPRESSION / MDM / [place] / ED COURSE  I reviewed the triage vital signs and the nursing notes.                              Differential diagnosis includes, but is not limited to, healing scalp wound, suture removal, laceration care etc.  Patient appears to have evidence of improved symptomatology and symptoms of infection seem to abated, he plans to complete his antibiotic.  His laceration is well adhered without evidence of dehiscence.  4 staples removed without difficulty  Patient's  presentation is most consistent with acute, uncomplicated illness.  Return precautions and treatment recommendations and follow-up discussed with the patient who is agreeable with the plan.     FINAL CLINICAL IMPRESSION(S) / ED DIAGNOSES   Final diagnoses:  Removal of staples     Rx / DC Orders   ED Discharge Orders     None        Note:  This document was prepared using Dragon voice recognition software and may include unintentional dictation errors.   Delman Kitten, MD 01/20/22 2121

## 2022-01-23 ENCOUNTER — Other Ambulatory Visit: Payer: Self-pay

## 2022-01-26 ENCOUNTER — Other Ambulatory Visit: Payer: Self-pay

## 2022-04-06 ENCOUNTER — Emergency Department
Admission: EM | Admit: 2022-04-06 | Discharge: 2022-04-06 | Disposition: A | Discharge status: Home / Self Care | Payer: Self-pay | Attending: Emergency Medicine | Admitting: Emergency Medicine

## 2022-04-06 ENCOUNTER — Other Ambulatory Visit: Payer: Self-pay

## 2022-04-06 DIAGNOSIS — I10 Essential (primary) hypertension: Secondary | ICD-10-CM | POA: Insufficient documentation

## 2022-04-06 DIAGNOSIS — J101 Influenza due to other identified influenza virus with other respiratory manifestations: Secondary | ICD-10-CM | POA: Insufficient documentation

## 2022-04-06 DIAGNOSIS — Z91119 Patient's noncompliance with dietary regimen due to unspecified reason: Secondary | ICD-10-CM | POA: Insufficient documentation

## 2022-04-06 DIAGNOSIS — Z91199 Patient's noncompliance with other medical treatment and regimen due to unspecified reason: Secondary | ICD-10-CM

## 2022-04-06 DIAGNOSIS — Z1152 Encounter for screening for COVID-19: Secondary | ICD-10-CM | POA: Insufficient documentation

## 2022-04-06 LAB — RESP PANEL BY RT-PCR (RSV, FLU A&B, COVID)  RVPGX2
Influenza A by PCR: POSITIVE — AB
Influenza B by PCR: NEGATIVE
Resp Syncytial Virus by PCR: NEGATIVE
SARS Coronavirus 2 by RT PCR: NEGATIVE

## 2022-04-06 MED ORDER — AMLODIPINE BESYLATE 5 MG PO TABS
10.0000 mg | ORAL_TABLET | Freq: Every day | ORAL | 2 refills | Status: AC
  Filled 2022-04-06: qty 60, 30d supply, fill #0

## 2022-04-06 MED ORDER — OSELTAMIVIR PHOSPHATE 75 MG PO CAPS
75.0000 mg | ORAL_CAPSULE | Freq: Two times a day (BID) | ORAL | 0 refills | Status: AC
  Filled 2022-04-06: qty 3, 2d supply, fill #0
  Filled 2022-04-06: qty 7, 3d supply, fill #0

## 2022-04-06 NOTE — Discharge Instructions (Addendum)
Begin taking Tamiflu for your influenza A.  Drink lots of fluids.  Tylenol or ibuprofen as needed for fever, body aches or headache. Your blood pressure also was elevated and a prescription for your blood pressure medication was sent to the pharmacy.  You must obtain a primary care provider to continue management of your blood pressure to avoid a stroke or heart attack in the future.  Please go to the following website to schedule new (and existing) patient appointments:   [email or web address]   The following is a list of primary care offices in the area who are accepting new patients at this time.  Please reach out to one of them directly and let them know you would like to schedule an appointment to follow up on an Emergency Department visit, and/or to establish a new primary care provider (PCP).  There are likely other primary care clinics in the are who are accepting new patients, but this is an excellent place to start:  [facility] Lead physician: Dr Shirlee Latch [address] [place], [place] [address] [phone]  [facility] Lead Physician: Dr Alba Cory [address], [place], [place] [address] [phone]  [facility]  Lead Physician: Dr Olevia Perches [address] [place], [place] [address] [phone]  [facility] Lead Physician: Dr Sofie Hartigan [address] [place], [place] [address] [phone]  [facility] Lead Physician: Dr Bari Edward [address] [place], [place], [place] [address] [phone]

## 2022-04-06 NOTE — ED Provider Notes (Signed)
[facility] Provider Note    Event Date/Time   First MD Initiated Contact with Patient 04/06/22 1405     (approximate)   History   Fever, Nasal Congestion, and Headache   HPI  Francisco Barrett is a 41 y.o. male presents to the ED with complaint of not feeling well since he woke up this morning.  Patient states that he has not taken his temperature but feels like he is running fever and then has chills.  He complains of fever, congestion and headache.  He denies any vomiting or diarrhea.  Patient took aspirin 2 hours prior to arrival.  Patient has a history of hypertension and polysubstance abuse.     Physical Exam   Triage Vital Signs: ED Triage Vitals  Enc Vitals Group     BP 04/06/22 1354 (!) 153/119     Pulse Rate 04/06/22 1354 96     Resp 04/06/22 1354 19     Temp 04/06/22 1354 98.6 F (37 C)     Temp Source 04/06/22 1354 Oral     SpO2 04/06/22 1354 99 %     Weight 04/06/22 1350 125 lb (56.7 kg)     Height 04/06/22 1350 5\' 3"  (1.6 m)     Head Circumference --      Peak Flow --      Pain Score 04/06/22 1350 8     Pain Loc --      Pain Edu? --      Excl. in [place]? --     Most recent vital signs: Vitals:   04/06/22 1354 04/06/22 1508  BP: (!) 153/119 (!) 150/108  Pulse: 96 88  Resp: 19 16  Temp: 98.6 F (37 C)   SpO2: 99% 99%     General: Awake, no distress.  CV:  Good peripheral perfusion.  Heart regular rate and rhythm. Resp:  Normal effort.  Lungs are clear bilaterally. Abd:  No distention.  Other:  Nasal congestion present.   ED Results / Procedures / Treatments   Labs (all labs ordered are listed, but only abnormal results are displayed) Labs Reviewed  RESP PANEL BY RT-PCR (RSV, FLU A&B, COVID)  RVPGX2 - Abnormal; Notable for the following components:      Result Value   Influenza A by PCR POSITIVE (*)    All other components within normal limits      PROCEDURES:  Critical Care performed:    Procedures   MEDICATIONS ORDERED IN ED: Medications - No data to display   IMPRESSION / MDM / [place] / ED COURSE  I reviewed the triage vital signs and the nursing notes.   Differential diagnosis includes, but is not limited to, COVID, influenza, RSV, bronchitis, viral illness, hypertension uncontrolled.  41 year old male presents to the ED with complaint of headache, fever and congestion when he woke this morning.  He also admits that he has not taken his blood pressure medication" several months".  He states he does not have a PCP at this time.  The last prescription for blood pressure medication that he got was Norvasc and he gets his medication from medication management.  A list of primary care providers was given to him and strongly encouraged to obtain 1 for continued management of his hypertension.  A prescription for Tamiflu was also sent.  He is aware that the amlodipine is being sent to the pharmacy along with 2 refills giving him a 90-day supply.  Patient's presentation is most consistent with acute complicated illness / injury requiring diagnostic workup.  FINAL CLINICAL IMPRESSION(S) / ED DIAGNOSES   Final diagnoses:  Influenza A  Hypertension, uncontrolled  Medically noncompliant     Rx / DC Orders   ED Discharge Orders          Ordered    amLODipine (NORVASC) 5 MG tablet  Daily        04/06/22 1505    oseltamivir (TAMIFLU) 75 MG capsule  2 times daily        04/06/22 1505             Note:  This document was prepared using Dragon voice recognition software and may include unintentional dictation errors.   Tommi Rumps, PA-C 04/06/22 1516    Phineas Semen, MD 04/07/22 [phone]
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
## 2022-04-06 NOTE — ED Provider Triage Note (Signed)
Emergency Medicine Provider Triage Evaluation Note  Francisco Barrett , a 41 y.o. male  was evaluated in triage.  Pt complains of cough, congestion, headache, fever and chills starting last evening.  Possible sick exposure at work.  Review of Systems  Positive: Cough Negative: No vomiting or diarrhea  Physical Exam  Ht 5\' 3"  (1.6 m)   Wt 56.7 kg   BMI 22.14 kg/m  Gen:   Awake, no distress   Resp:  Normal effort, lungs are clear bilaterally. MSK:   Moves extremities without difficulty  Other:    Medical Decision Making  Medically screening exam initiated at 1:51 PM.  Appropriate orders placed.  Rodolphe Farrell Pantaleo was informed that the remainder of the evaluation will be completed by another provider, this initial triage assessment does not replace that evaluation, and the importance of remaining in the ED until their evaluation is complete.     Leonia Reeves, PA-C 04/06/22 1353

## 2022-04-07 ENCOUNTER — Other Ambulatory Visit: Payer: Self-pay

## 2022-04-17 ENCOUNTER — Other Ambulatory Visit: Payer: Self-pay

## 2022-04-21 ENCOUNTER — Other Ambulatory Visit: Payer: Self-pay

## 2023-01-11 ENCOUNTER — Other Ambulatory Visit: Payer: Self-pay

## 2023-01-11 ENCOUNTER — Emergency Department
Admission: EM | Admit: 2023-01-11 | Discharge: 2023-01-11 | Disposition: A | Discharge status: Home / Self Care | Payer: Self-pay | Attending: Emergency Medicine | Admitting: Emergency Medicine

## 2023-01-11 ENCOUNTER — Encounter: Payer: Self-pay | Admitting: *Deleted

## 2023-01-11 DIAGNOSIS — M436 Torticollis: Secondary | ICD-10-CM | POA: Insufficient documentation

## 2023-01-11 MED ORDER — PREDNISONE 10 MG PO TABS
ORAL_TABLET | ORAL | 0 refills | Status: AC
  Filled 2023-01-11: qty 21, 6d supply, fill #0

## 2023-01-11 MED ORDER — CYCLOBENZAPRINE HCL 10 MG PO TABS
10.0000 mg | ORAL_TABLET | Freq: Once | ORAL | Status: AC
  Administered 2023-01-11: 10 mg via ORAL
  Filled 2023-01-11: qty 1

## 2023-01-11 MED ORDER — BACLOFEN 10 MG PO TABS
10.0000 mg | ORAL_TABLET | Freq: Three times a day (TID) | ORAL | 0 refills | Status: AC
  Filled 2023-01-11: qty 21, 7d supply, fill #0

## 2023-01-11 NOTE — ED Triage Notes (Signed)
Pt woke up with neck stiffness and pain.  Pain extends into right shoulder, pain increases with attempt to move.

## 2023-01-11 NOTE — ED Provider Notes (Signed)
[facility] Provider Note    Event Date/Time   First MD Initiated Contact with Patient 01/11/23 1728     (approximate)   History   Torticollis   HPI  Francisco Barrett is a 42 y.o. male with history of EtOH, cocaine abuse presents emergency department with complaints of neck pain.  States muscle feels sore.  No known injury.  States hurts to turn his head.  Pain radiates into the shoulder.  No numbness or tingling.  No chest pain or shortness of breath.      Physical Exam   Triage Vital Signs: ED Triage Vitals  Encounter Vitals Group     BP 01/11/23 1659 (!) 134/121     Systolic BP Percentile --      Diastolic BP Percentile --      Pulse Rate 01/11/23 1659 93     Resp 01/11/23 1659 20     Temp 01/11/23 1659 98.3 F (36.8 C)     Temp Source 01/11/23 1659 Oral     SpO2 01/11/23 1659 100 %     Weight 01/11/23 1725 125 lb (56.7 kg)     Height 01/11/23 1725 5\' 3"  (1.6 m)     Head Circumference --      Peak Flow --      Pain Score 01/11/23 1658 10     Pain Loc --      Pain Education --      Exclude from Growth Chart --     Most recent vital signs: Vitals:   01/11/23 1659  BP: (!) 134/121  Pulse: 93  Resp: 20  Temp: 98.3 F (36.8 C)  SpO2: 100%     General: Awake, no distress.   CV:  Good peripheral perfusion. regular rate and  rhythm Resp:  Normal effort.  Abd:  No distention.   Other:  C-spine nontender,.  Vertebral muscles tender, spasm along the trapezius, grips equal bilaterally, neurovascular intact   ED Results / Procedures / Treatments   Labs (all labs ordered are listed, but only abnormal results are displayed) Labs Reviewed - No data to display   EKG     RADIOLOGY     PROCEDURES:   Procedures   MEDICATIONS ORDERED IN ED: Medications  cyclobenzaprine (FLEXERIL) tablet 10 mg (has no administration in time range)     IMPRESSION / MDM / ASSESSMENT AND PLAN / ED COURSE  I reviewed the triage  vital signs and the nursing notes.                              Differential diagnosis includes, but is not limited to, torticollis, muscle strain, cervical radiculopathy  Patient's presentation is most consistent with acute, uncomplicated illness.   Patient's exam is consistent with torticollis.  Gave him Flexeril while here in the ED.  Prescription for steroid pack along with baclofen.  Patient has a history of EtOH abuse to have concerns about giving him NSAIDs due to GI bleed chances.  He is in agreement treatment plan.  Discharged stable condition.      FINAL CLINICAL IMPRESSION(S) / ED DIAGNOSES   Final diagnoses:  Torticollis     Rx / DC Orders   ED Discharge Orders          Ordered    predniSONE (STERAPRED UNI-PAK 21 TAB) 10 MG (21) TBPK tablet        01/11/23 1734  baclofen (LIORESAL) 10 MG tablet  3 times daily        01/11/23 1734             Note:  This document was prepared using Dragon voice recognition software and may include unintentional dictation errors.    Faythe Ghee, PA-C 01/11/23 2125    Minna Antis, MD 01/11/23 2248

## 2023-01-11 NOTE — Discharge Instructions (Signed)
Use ice on the neck and shoulder

## 2023-01-12 ENCOUNTER — Other Ambulatory Visit: Payer: Self-pay

## 2023-02-28 IMAGING — CT CT CERVICAL SPINE W/O CM
3 of 4 series · 13 of 33 positions shown, 16 images · non-contrast
Comparison: CT head dated 10/06/2020

CLINICAL DATA: Altered mental status, slurring words

EXAM:
CT HEAD WITHOUT CONTRAST
CT CERVICAL SPINE WITHOUT CONTRAST
TECHNIQUE: Multidetector CT imaging of the head and cervical spine was
performed following the standard protocol without intravenous
contrast. Multiplanar CT image reconstructions of the cervical spine
were also generated.

[Series 6: orthogonal bone · axial · 0.29mm/px · z∈[-343,-222]mm · 5 of 99 slices shown, 7 images]
[im 15/99  soft-tissue]
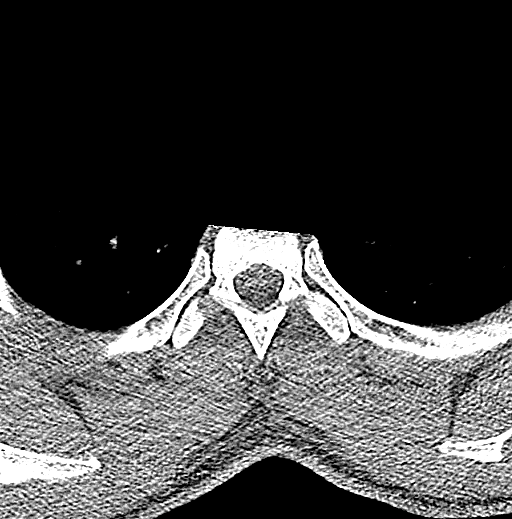
[im 15/99  bone]
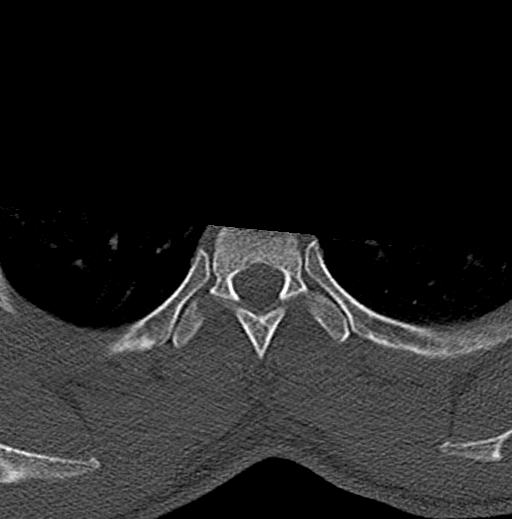
[im 29/99  bone]
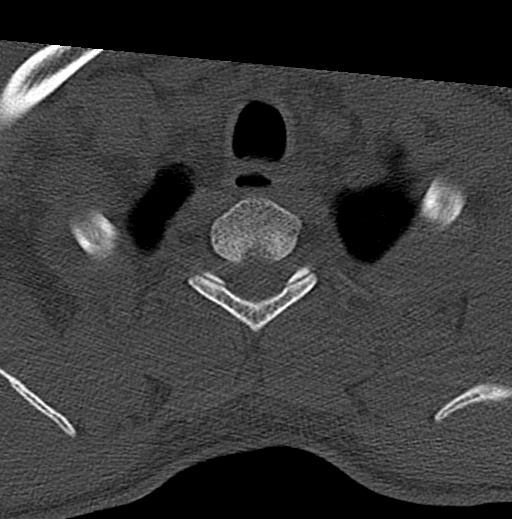
[im 57/99  bone]
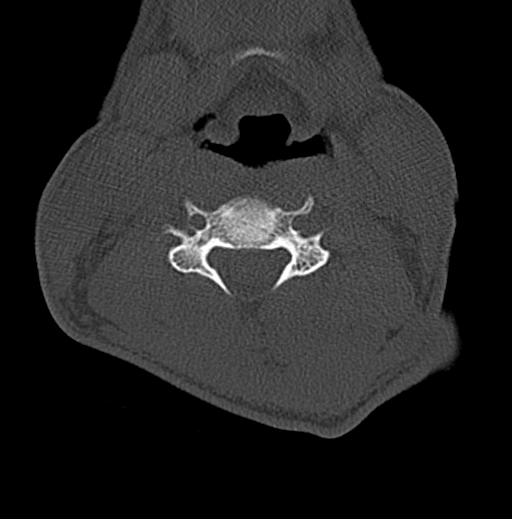
[im 71/99  bone]
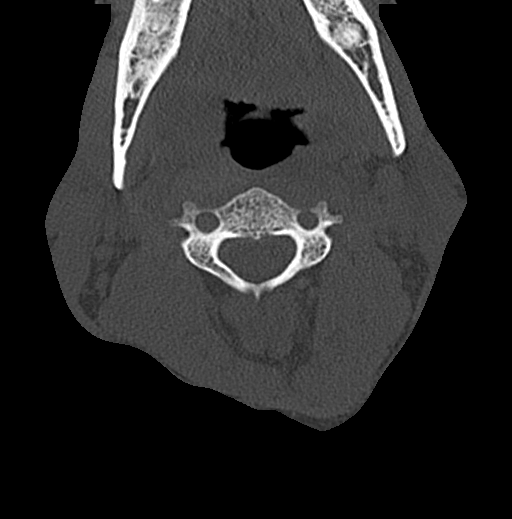
[im 85/99  soft-tissue]
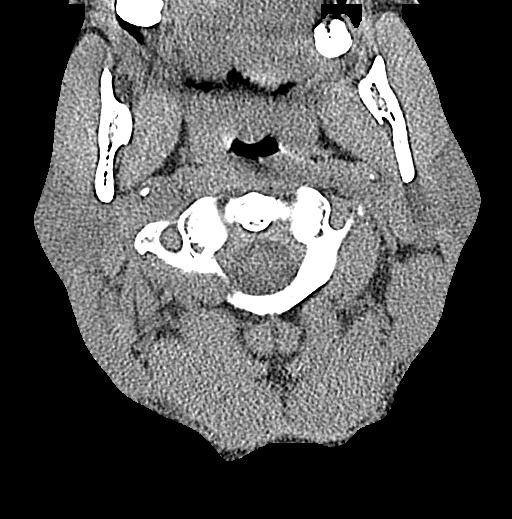
[im 85/99  bone]
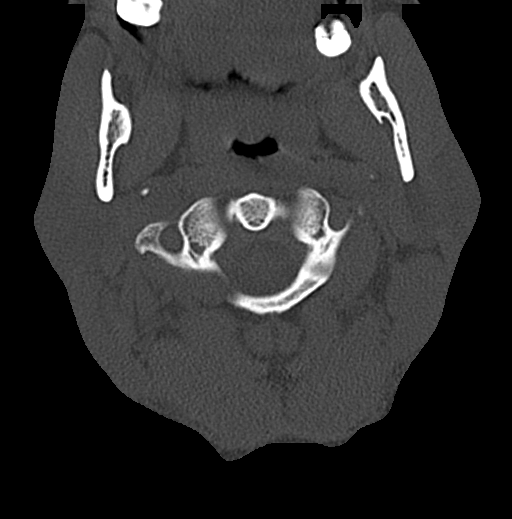

[Series 7: sagittal bone · sagittal · 0.28mm/px · 5 of 69 slices shown, 6 images]
[im 23/69  bone]
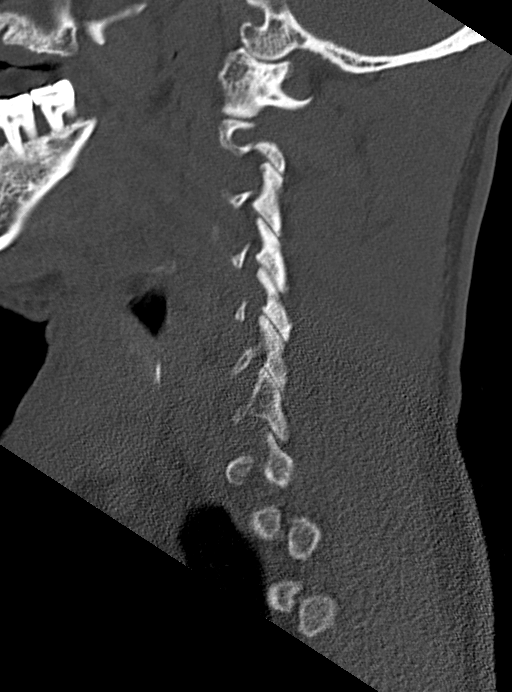
[im 29/69  bone]
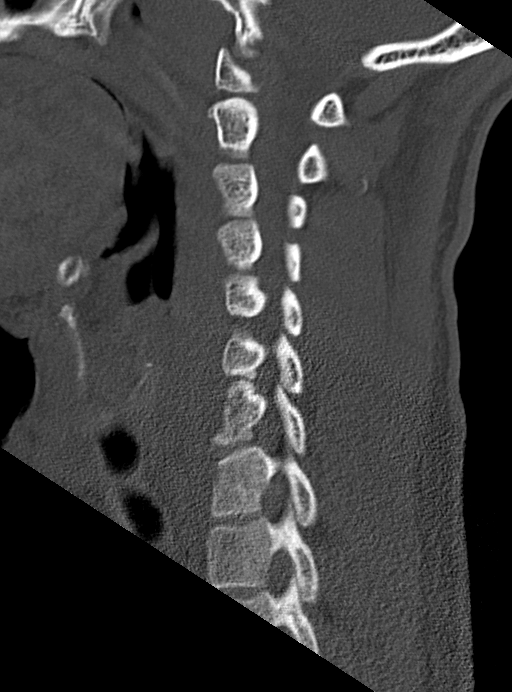
[im 35/69  soft-tissue]
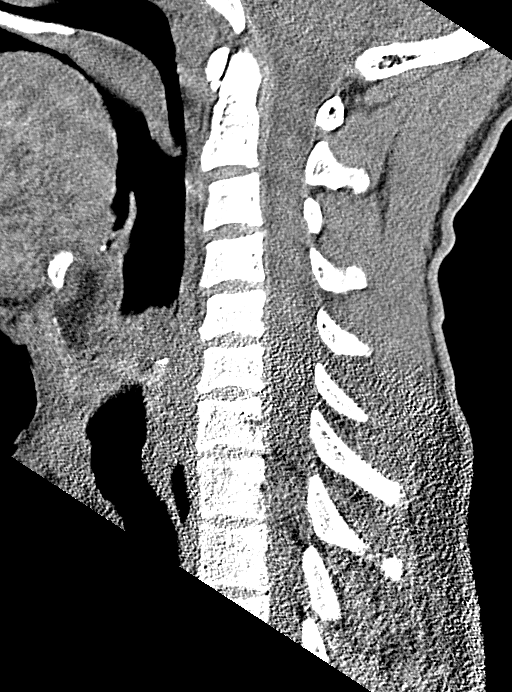
[im 35/69  bone]
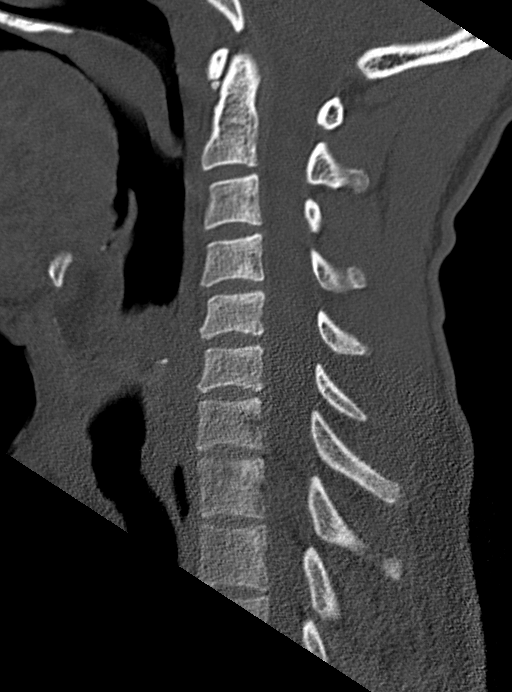
[im 40/69  bone]
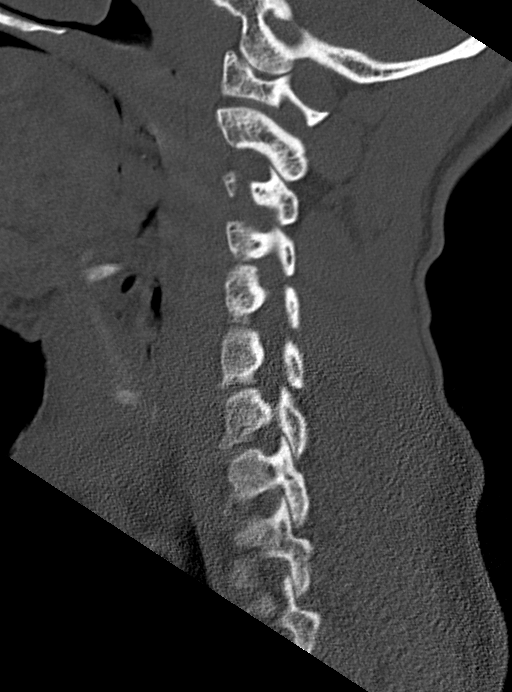
[im 46/69  bone]
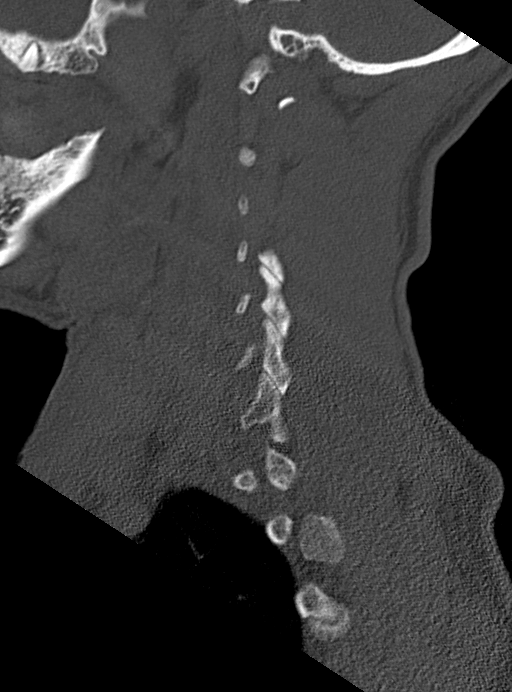

[Series 8: coronal bone · coronal · 0.27mm/px · 3 of 71 slices shown]
[im 21/71  bone]
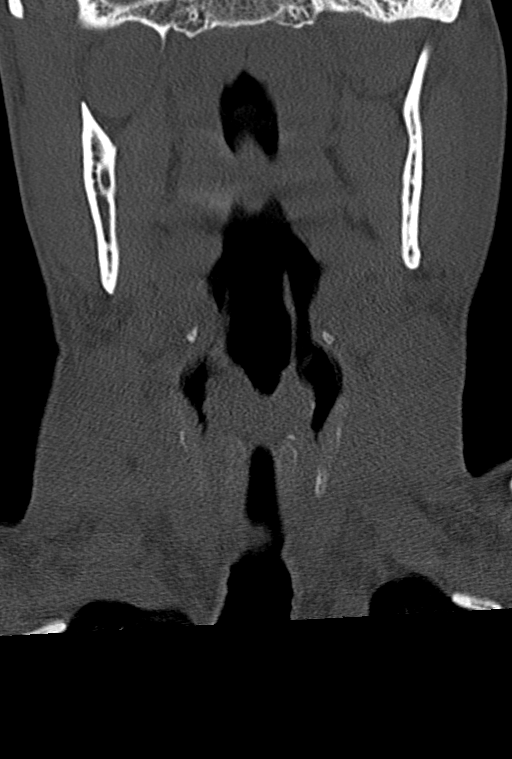
[im 31/71  bone]
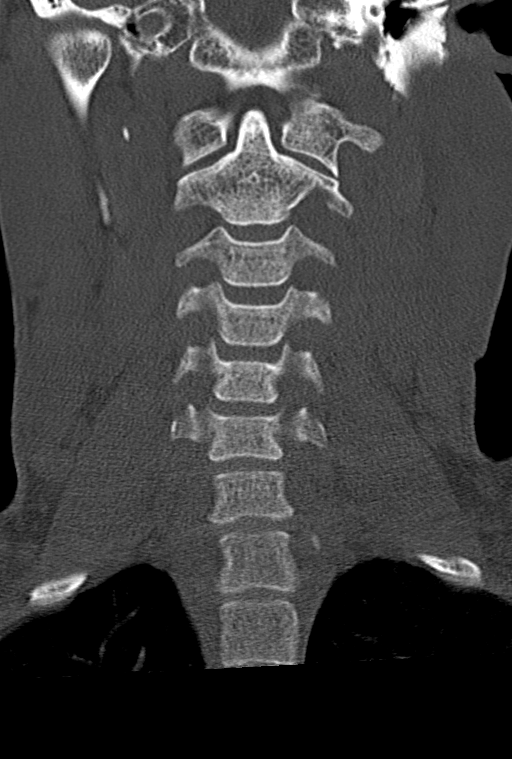
[im 40/71  bone]
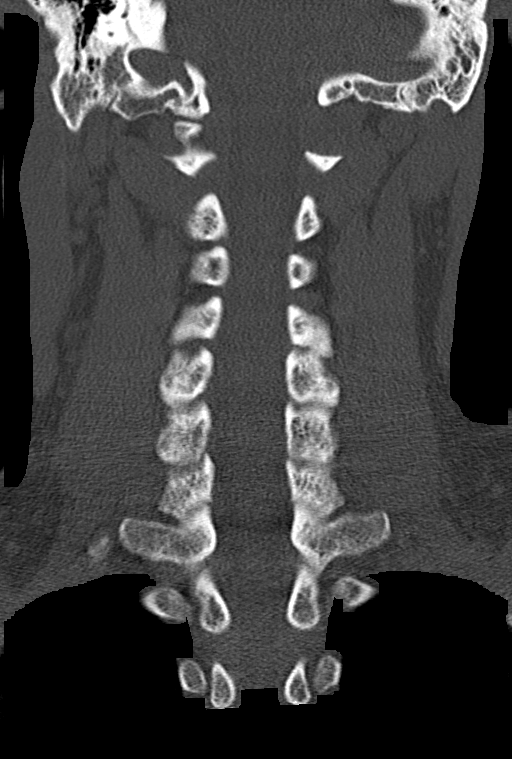

[13 of 33 positions shown; findings below may reference images not displayed]

FINDINGS: CT HEAD FINDINGS

Brain: No evidence of acute infarction, hemorrhage, hydrocephalus,
extra-axial collection or mass lesion/mass effect.

Vascular: No hyperdense vessel or unexpected calcification.

Skull: Normal. Negative for fracture or focal lesion.

Sinuses/Orbits: Near-complete opacification of the right maxillary
sinus. Visualized paranasal sinuses and mastoid air cells are
otherwise clear.

Other: None.

CT CERVICAL SPINE FINDINGS

Alignment: Straightening of the cervical spine, likely positional.

Skull base and vertebrae: No acute fracture. No primary bone lesion
or focal pathologic process.

Soft tissues and spinal canal: No prevertebral fluid or swelling. No
visible canal hematoma.

Disc levels: Intervertebral disc spaces are preserved. Spinal canal
is patent.

Upper chest: Visualized lung apices are clear.

Other: Visualized thyroid is unremarkable.
IMPRESSION: Normal head CT.

Normal cervical spine CT.

## 2023-02-28 IMAGING — CT CT HEAD W/O CM
3 series · 15 of 47 positions shown, 18 images · non-contrast
Comparison: CT head dated 10/06/2020

CLINICAL DATA: Altered mental status, slurring words

EXAM:
CT HEAD WITHOUT CONTRAST
CT CERVICAL SPINE WITHOUT CONTRAST
TECHNIQUE: Multidetector CT imaging of the head and cervical spine was
performed following the standard protocol without intravenous
contrast. Multiplanar CT image reconstructions of the cervical spine
were also generated.

[Series 2: head wo · axial · 0.45mm/px · z∈[-167,-37]mm · 9 of 32 slices shown, 12 images]
[im 3/32  brain]
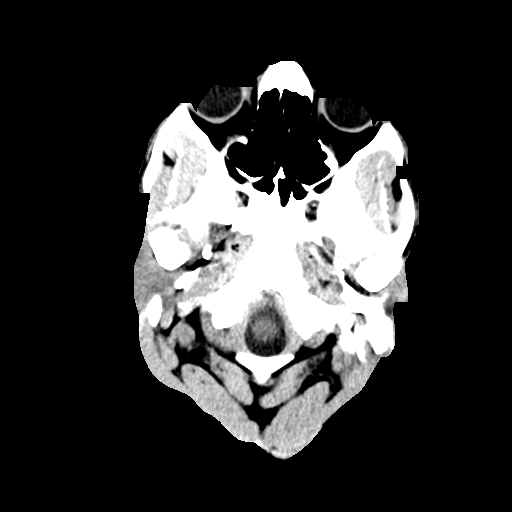
[im 3/32  bone]
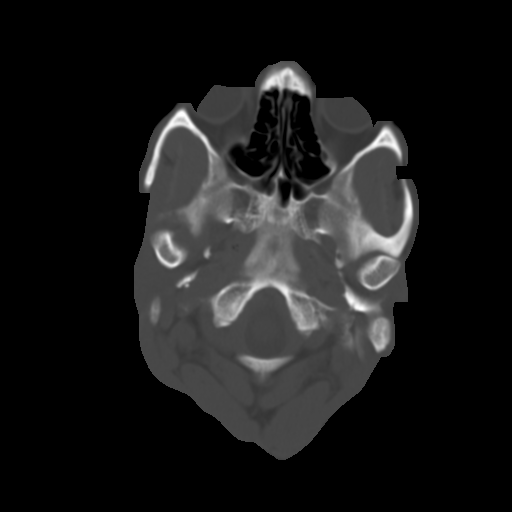
[im 6/32  brain]
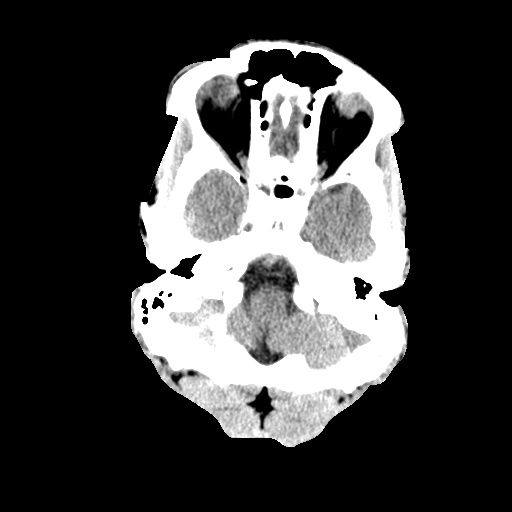
[im 9/32  brain]
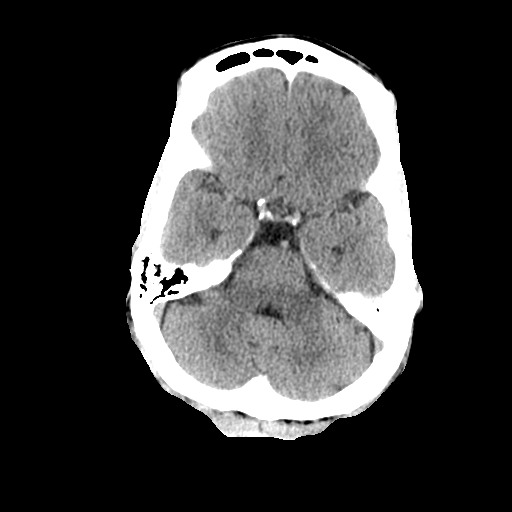
[im 12/32  brain]
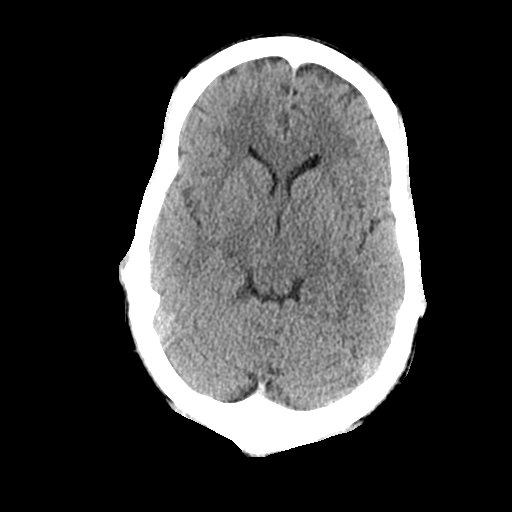
[im 17/32  brain]
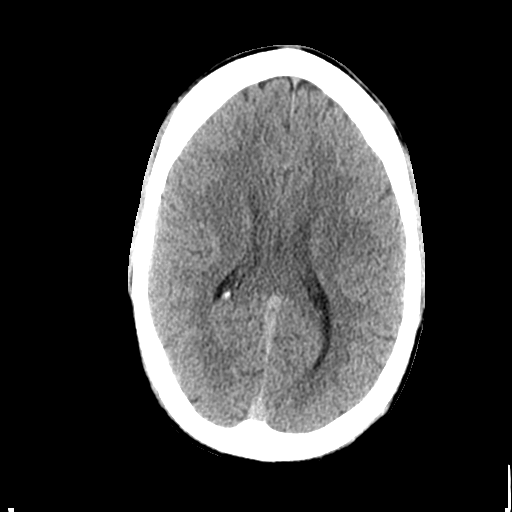
[im 17/32  bone]
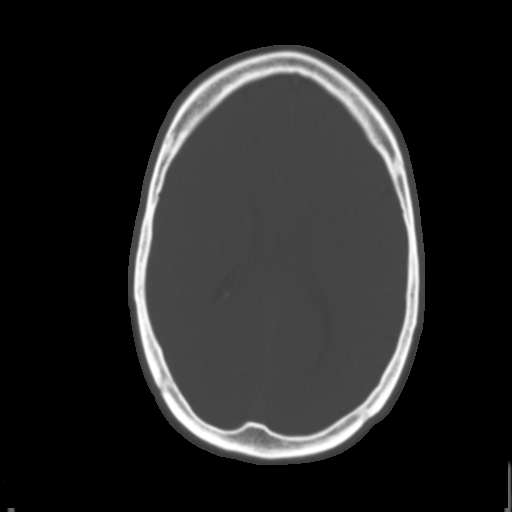
[im 20/32  brain]
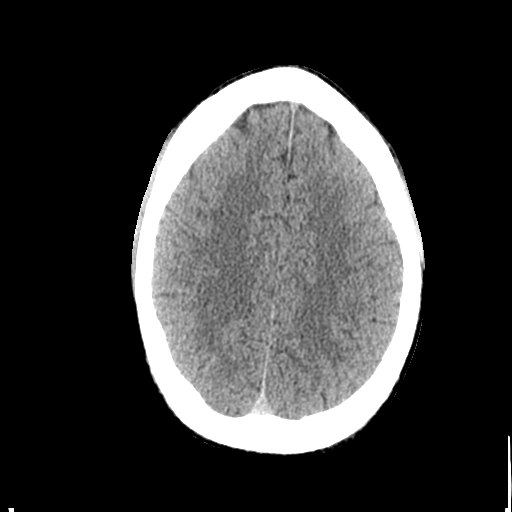
[im 23/32  brain]
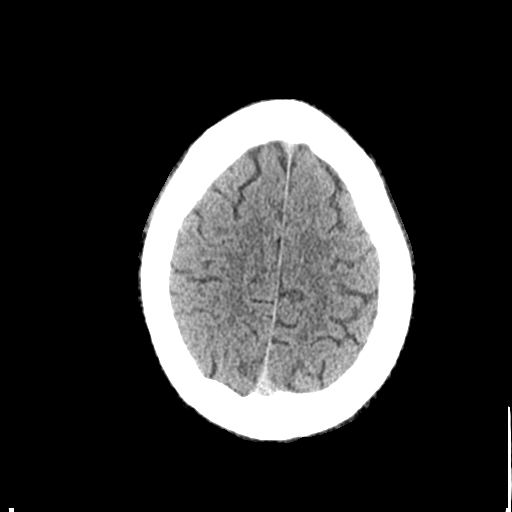
[im 26/32  brain]
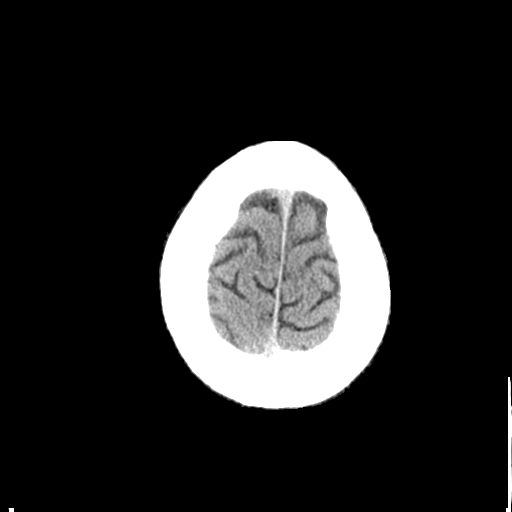
[im 29/32  brain]
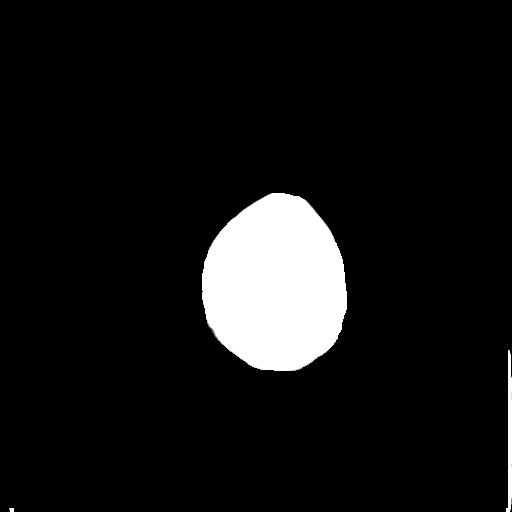
[im 29/32  bone]
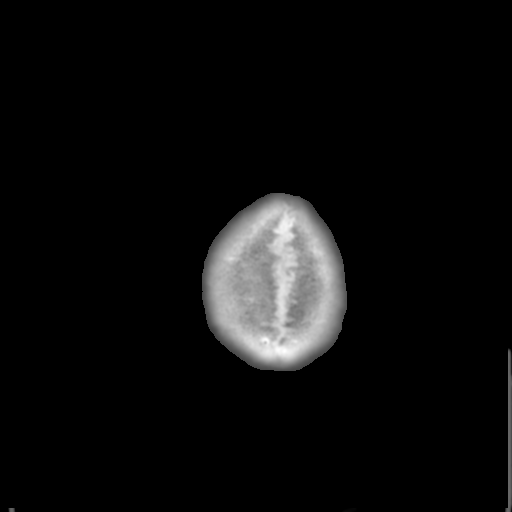

[Series 4: coronal soft tissue · coronal · 0.31mm/px · 3 of 70 slices shown]
[im 24/70  brain]
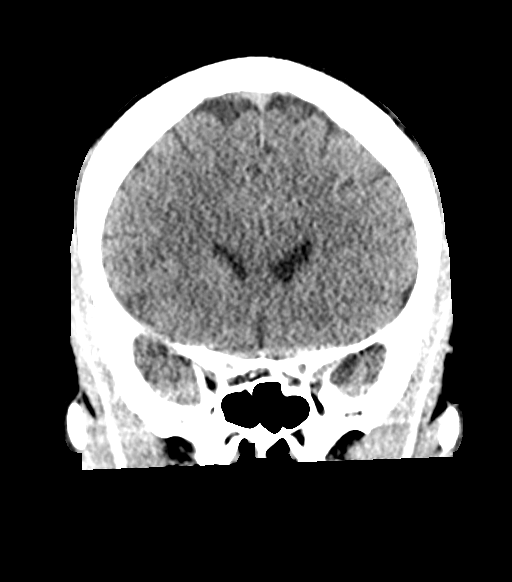
[im 31/70  brain]
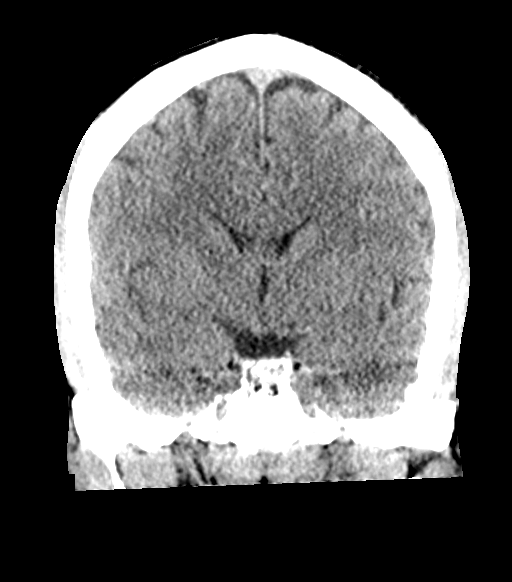
[im 39/70  brain]
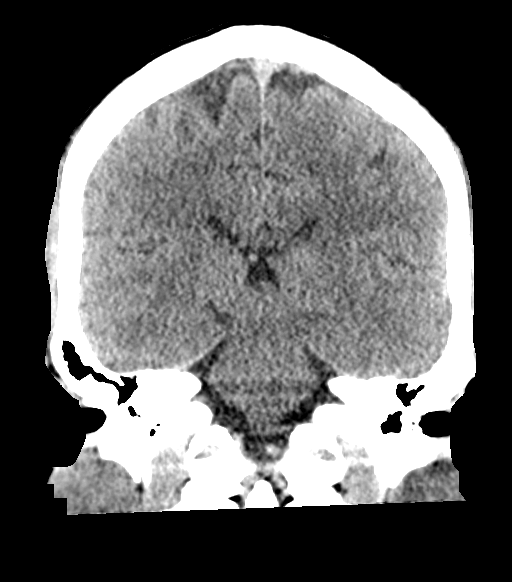

[Series 5: sagittal soft tissue · sagittal · 0.37mm/px · 3 of 54 slices shown]
[im 18/54  brain]
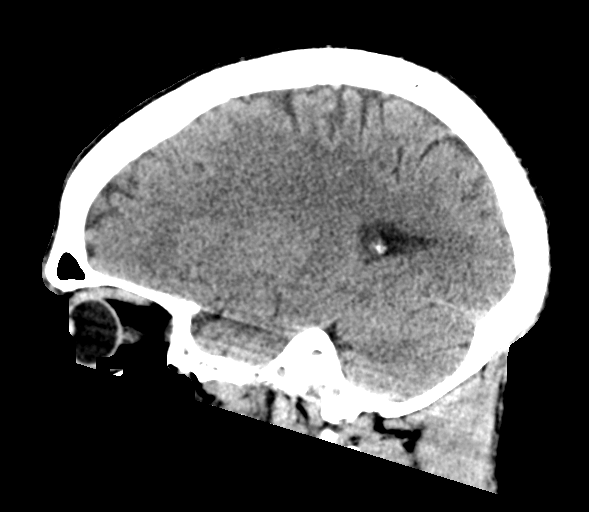
[im 27/54  brain]
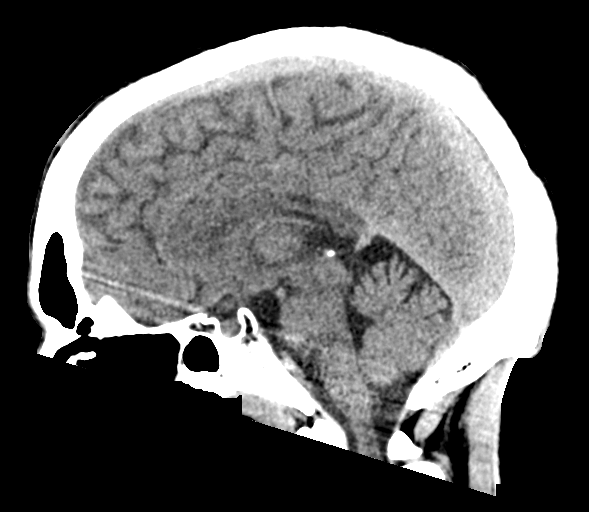
[im 36/54  brain]
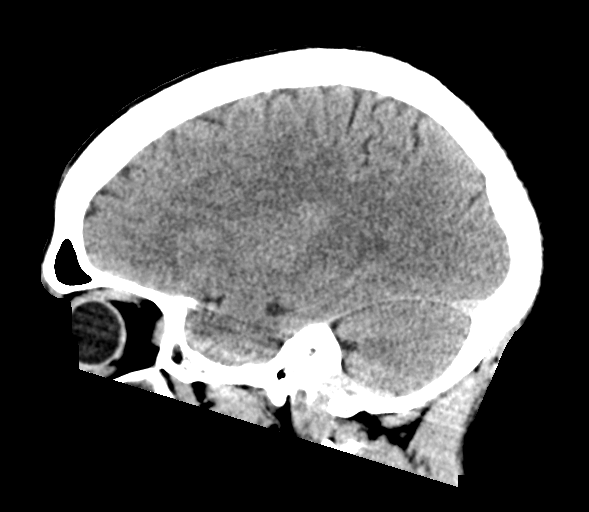

[15 of 47 positions shown; findings below may reference images not displayed]

FINDINGS: CT HEAD FINDINGS

Brain: No evidence of acute infarction, hemorrhage, hydrocephalus,
extra-axial collection or mass lesion/mass effect.

Vascular: No hyperdense vessel or unexpected calcification.

Skull: Normal. Negative for fracture or focal lesion.

Sinuses/Orbits: Near-complete opacification of the right maxillary
sinus. Visualized paranasal sinuses and mastoid air cells are
otherwise clear.

Other: None.

CT CERVICAL SPINE FINDINGS

Alignment: Straightening of the cervical spine, likely positional.

Skull base and vertebrae: No acute fracture. No primary bone lesion
or focal pathologic process.

Soft tissues and spinal canal: No prevertebral fluid or swelling. No
visible canal hematoma.

Disc levels: Intervertebral disc spaces are preserved. Spinal canal
is patent.

Upper chest: Visualized lung apices are clear.

Other: Visualized thyroid is unremarkable.
IMPRESSION: Normal head CT.

Normal cervical spine CT.

## 2023-03-01 ENCOUNTER — Encounter: Payer: Self-pay | Admitting: *Deleted

## 2023-03-01 ENCOUNTER — Other Ambulatory Visit: Payer: Self-pay

## 2023-03-01 ENCOUNTER — Emergency Department
Admission: EM | Admit: 2023-03-01 | Discharge: 2023-03-01 | Discharge status: Against Medical Advice | Payer: Self-pay | Attending: Emergency Medicine | Admitting: Emergency Medicine

## 2023-03-01 DIAGNOSIS — K0889 Other specified disorders of teeth and supporting structures: Secondary | ICD-10-CM | POA: Insufficient documentation

## 2023-03-01 DIAGNOSIS — Z5321 Procedure and treatment not carried out due to patient leaving prior to being seen by health care provider: Secondary | ICD-10-CM | POA: Insufficient documentation

## 2023-03-01 NOTE — ED Notes (Signed)
No answer when called several times from lobby 

## 2023-03-01 NOTE — ED Triage Notes (Signed)
Pt reports right side tooth pain for 1 week.   No swelling noted.  Pt taking motrin without relief.  Pt alert.

## 2023-11-09 ENCOUNTER — Other Ambulatory Visit (HOSPITAL_BASED_OUTPATIENT_CLINIC_OR_DEPARTMENT_OTHER): Payer: Self-pay

## 2023-11-10 ENCOUNTER — Other Ambulatory Visit: Payer: Self-pay

## 2023-11-10 ENCOUNTER — Emergency Department
Admission: EM | Admit: 2023-11-10 | Discharge: 2023-11-10 | Disposition: A | Discharge status: Home / Self Care | Payer: Self-pay | Attending: Emergency Medicine | Admitting: Emergency Medicine

## 2023-11-10 ENCOUNTER — Emergency Department: Payer: Self-pay

## 2023-11-10 DIAGNOSIS — M25511 Pain in right shoulder: Secondary | ICD-10-CM | POA: Insufficient documentation

## 2023-11-10 MED ORDER — CYCLOBENZAPRINE HCL 5 MG PO TABS
5.0000 mg | ORAL_TABLET | Freq: Three times a day (TID) | ORAL | 0 refills | Status: AC | PRN
  Filled 2023-11-10: qty 30, 10d supply, fill #0

## 2023-11-10 MED ORDER — CYCLOBENZAPRINE HCL 10 MG PO TABS
5.0000 mg | ORAL_TABLET | Freq: Once | ORAL | Status: AC
  Administered 2023-11-10: 5 mg via ORAL
  Filled 2023-11-10: qty 1

## 2023-11-10 NOTE — Discharge Instructions (Signed)
 You were evaluated in the ED for right shoulder pain.  Your x-rays are normal.  There is no broken bone or dislocations.  Your physical exam findings are reassuring.  Plenty of rest and limit your physical activity.  You have been provided with a sling for comfort.  Alternate Tylenol  and ibuprofen  for pain as needed.  Apply a warm compress to the area this will help loosen up your muscles.  Follow-up with [facility] if symptoms persist.
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
## 2023-11-11 ENCOUNTER — Other Ambulatory Visit: Payer: Self-pay

## 2023-11-22 ENCOUNTER — Other Ambulatory Visit: Payer: Self-pay
